# Patient Record
Sex: Female | Born: 1945
Health system: Southern US, Community
[De-identification: ages and names within clinical notes are randomized; demographics above are authoritative.]

## PROBLEM LIST (undated history)

## (undated) DIAGNOSIS — K219 Gastro-esophageal reflux disease without esophagitis: Secondary | ICD-10-CM

## (undated) DIAGNOSIS — C4490 Unspecified malignant neoplasm of skin, unspecified: Secondary | ICD-10-CM

## (undated) DIAGNOSIS — E785 Hyperlipidemia, unspecified: Secondary | ICD-10-CM

## (undated) DIAGNOSIS — M1990 Unspecified osteoarthritis, unspecified site: Secondary | ICD-10-CM

## (undated) DIAGNOSIS — T7840XA Allergy, unspecified, initial encounter: Secondary | ICD-10-CM

## (undated) DIAGNOSIS — M8580 Other specified disorders of bone density and structure, unspecified site: Secondary | ICD-10-CM

## (undated) DIAGNOSIS — F329 Major depressive disorder, single episode, unspecified: Secondary | ICD-10-CM

## (undated) DIAGNOSIS — B029 Zoster without complications: Secondary | ICD-10-CM

## (undated) DIAGNOSIS — C439 Malignant melanoma of skin, unspecified: Secondary | ICD-10-CM

## (undated) DIAGNOSIS — I2510 Atherosclerotic heart disease of native coronary artery without angina pectoris: Secondary | ICD-10-CM

## (undated) DIAGNOSIS — C801 Malignant (primary) neoplasm, unspecified: Secondary | ICD-10-CM

## (undated) DIAGNOSIS — E559 Vitamin D deficiency, unspecified: Secondary | ICD-10-CM

## (undated) DIAGNOSIS — H269 Unspecified cataract: Secondary | ICD-10-CM

## (undated) HISTORY — DX: Unspecified cataract: H26.9

## (undated) HISTORY — PX: CHOLECYSTECTOMY: SHX55

## (undated) HISTORY — DX: Unspecified malignant neoplasm of skin, unspecified: C44.90

## (undated) HISTORY — DX: Vitamin D deficiency, unspecified: E55.9

## (undated) HISTORY — PX: ROTATOR CUFF REPAIR: SHX139

## (undated) HISTORY — DX: Malignant (primary) neoplasm, unspecified: C80.1

## (undated) HISTORY — DX: Atherosclerotic heart disease of native coronary artery without angina pectoris: I25.10

## (undated) HISTORY — DX: Hyperlipidemia, unspecified: E78.5

## (undated) HISTORY — DX: Zoster without complications: B02.9

## (undated) HISTORY — DX: Allergy, unspecified, initial encounter: T78.40XA

## (undated) HISTORY — DX: Major depressive disorder, single episode, unspecified: F32.9

## (undated) HISTORY — DX: Other specified disorders of bone density and structure, unspecified site: M85.80

## (undated) HISTORY — DX: Gastro-esophageal reflux disease without esophagitis: K21.9

## (undated) HISTORY — DX: Unspecified osteoarthritis, unspecified site: M19.90

## (undated) HISTORY — DX: Malignant melanoma of skin, unspecified: C43.9

## (undated) HISTORY — PX: TONSILLECTOMY AND ADENOIDECTOMY: SUR1326

## (undated) HISTORY — PX: OTHER SURGICAL HISTORY: SHX169

---

## 1999-09-21 ENCOUNTER — Encounter: Payer: Self-pay | Admitting: Family Medicine

## 1999-09-21 ENCOUNTER — Ambulatory Visit (HOSPITAL_COMMUNITY): Admission: RE | Admit: 1999-09-21 | Discharge: 1999-09-21 | Payer: Self-pay | Admitting: Family Medicine

## 2002-02-15 ENCOUNTER — Observation Stay (HOSPITAL_COMMUNITY): Admission: EM | Admit: 2002-02-15 | Discharge: 2002-02-16 | Payer: Self-pay | Admitting: Emergency Medicine

## 2002-02-15 ENCOUNTER — Encounter: Payer: Self-pay | Admitting: Surgery

## 2010-08-03 HISTORY — PX: CORNEAL TRANSPLANT: SHX108

## 2011-05-01 DIAGNOSIS — Z947 Corneal transplant status: Secondary | ICD-10-CM | POA: Insufficient documentation

## 2011-08-04 HISTORY — PX: FOOT FRACTURE SURGERY: SHX645

## 2011-08-04 HISTORY — PX: MELANOMA EXCISION: SHX5266

## 2012-01-28 ENCOUNTER — Other Ambulatory Visit: Payer: Self-pay | Admitting: Dermatology

## 2012-02-18 ENCOUNTER — Other Ambulatory Visit: Payer: Self-pay | Admitting: Dermatology

## 2012-05-18 ENCOUNTER — Other Ambulatory Visit: Payer: Self-pay | Admitting: Dermatology

## 2012-05-20 ENCOUNTER — Ambulatory Visit: Payer: Self-pay

## 2012-05-20 ENCOUNTER — Other Ambulatory Visit: Payer: Self-pay | Admitting: Occupational Medicine

## 2012-05-20 DIAGNOSIS — M79673 Pain in unspecified foot: Secondary | ICD-10-CM

## 2012-08-18 ENCOUNTER — Other Ambulatory Visit: Payer: Self-pay | Admitting: Dermatology

## 2012-11-17 ENCOUNTER — Other Ambulatory Visit: Payer: Self-pay | Admitting: Dermatology

## 2013-03-11 ENCOUNTER — Encounter: Payer: Self-pay | Admitting: Internal Medicine

## 2013-05-18 ENCOUNTER — Other Ambulatory Visit: Payer: Self-pay | Admitting: Dermatology

## 2013-06-12 ENCOUNTER — Encounter: Payer: Self-pay | Admitting: Internal Medicine

## 2013-08-07 ENCOUNTER — Ambulatory Visit: Payer: Medicare Other | Admitting: *Deleted

## 2013-08-07 VITALS — Ht 67.0 in | Wt 156.4 lb

## 2013-08-07 DIAGNOSIS — Z1211 Encounter for screening for malignant neoplasm of colon: Secondary | ICD-10-CM

## 2013-08-07 MED ORDER — MOVIPREP 100 G PO SOLR: 1.0000 | Freq: Once | ORAL | Status: DC

## 2013-08-07 NOTE — Progress Notes (Signed)
No egg or soy allergy. No anesthesia problems.  

## 2013-08-11 ENCOUNTER — Encounter: Payer: Self-pay | Admitting: Internal Medicine

## 2013-08-18 ENCOUNTER — Encounter: Payer: Self-pay | Admitting: Internal Medicine

## 2013-08-18 ENCOUNTER — Ambulatory Visit (AMBULATORY_SURGERY_CENTER): Payer: Medicare Other | Admitting: Internal Medicine

## 2013-08-18 VITALS — BP 121/54 | HR 61 | Temp 98.1°F | Resp 18 | Ht 67.0 in | Wt 156.0 lb

## 2013-08-18 DIAGNOSIS — Z1211 Encounter for screening for malignant neoplasm of colon: Secondary | ICD-10-CM

## 2013-08-18 MED ORDER — SODIUM CHLORIDE 0.9 % IV SOLN: 500.0000 mL | INTRAVENOUS | Status: DC

## 2013-08-18 NOTE — Op Note (Signed)
[facility]  [address] [place], [id number]   COLONOSCOPY PROCEDURE REPORT  PATIENT: Sherri, Morris  MR#: [id number] BIRTHDATE: 09-Jan-1946 , 67  yrs. old GENDER: Female ENDOSCOPIST: Lafayette Dragon, MD REFERRED HM:CNOB Perini, M.D. PROCEDURE DATE:  08/18/2013 PROCEDURE:   Colonoscopy, screening First Screening Colonoscopy - Avg.  risk and is 50 yrs.  old or older - No.  Prior Negative Screening - Now for repeat screening. 10 or more years since last screening  History of Adenoma - Now for follow-up colonoscopy & has been > or = to 3 yrs.  N/A  Polyps Removed Today? No.  Recommend repeat exam, <10 yrs? No. ASA CLASS:   Class II INDICATIONS:Average risk patient for colon cancer and last colonoscopy October 2004 was normal. MEDICATIONS: MAC sedation, administered by CRNA and propofol (Diprivan) 200mg  IV  DESCRIPTION OF PROCEDURE:   After the risks benefits and alternatives of the procedure were thoroughly explained, informed consent was obtained.  A digital rectal exam revealed no abnormalities of the rectum.   The LB [id number]  endoscope was introduced through the anus and advanced to the cecum, which was identified by both the appendix and ileocecal valve. No adverse events experienced.   The quality of the prep was excellent, using MoviPrep  The instrument was then slowly withdrawn as the colon was fully examined.      COLON FINDINGS: A normal appearing cecum, ileocecal valve, and appendiceal orifice were identified.  The ascending, hepatic flexure, transverse, splenic flexure, descending, sigmoid colon and rectum appeared unremarkable.  No polyps or cancers were seen. Retroflexed views revealed no abnormalities. The time to cecum=6 minutes 55 seconds.  Withdrawal time=6 minutes 05 seconds.  The scope was withdrawn and the procedure completed. COMPLICATIONS: There were no complications.  ENDOSCOPIC IMPRESSION: Normal  colon  RECOMMENDATIONS: high fiber diet Recall colonoscopy in 10 years   eSigned:  Lafayette Dragon, MD 08/18/2013 8:31 AM   cc:

## 2013-08-18 NOTE — Progress Notes (Signed)
NO EGG OR SOY ALLERGY EWM NO PROBLEMS WITH PAST SEDATION. EWM 

## 2013-08-18 NOTE — Progress Notes (Signed)
A/ox3 pleased with MAC, report to Karol RN 

## 2013-08-18 NOTE — Patient Instructions (Addendum)
Normal Colonoscopy.  Repeat in 10 years.  YOU HAD AN ENDOSCOPIC PROCEDURE TODAY AT [facility]: Refer to the procedure report that was given to you for any specific questions about what was found during the examination.  If the procedure report does not answer your questions, please call your gastroenterologist to clarify.  If you requested that your care partner not be given the details of your procedure findings, then the procedure report has been included in a sealed envelope for you to review at your convenience later.  YOU SHOULD EXPECT: Some feelings of bloating in the abdomen. Passage of more gas than usual.  Walking can help get rid of the air that was put into your GI tract during the procedure and reduce the bloating. If you had a lower endoscopy (such as a colonoscopy or flexible sigmoidoscopy) you may notice spotting of blood in your stool or on the toilet paper. If you underwent a bowel prep for your procedure, then you may not have a normal bowel movement for a few days.  DIET: Your first meal following the procedure should be a light meal and then it is ok to progress to your normal diet.  A half-sandwich or bowl of soup is an example of a good first meal.  Heavy or fried foods are harder to digest and may make you feel nauseous or bloated.  Likewise meals heavy in dairy and vegetables can cause extra gas to form and this can also increase the bloating.  Drink plenty of fluids but you should avoid alcoholic beverages for 24 hours.  ACTIVITY: Your care partner should take you home directly after the procedure.  You should plan to take it easy, moving slowly for the rest of the day.  You can resume normal activity the day after the procedure however you should NOT DRIVE or use heavy machinery for 24 hours (because of the sedation medicines used during the test).    SYMPTOMS TO REPORT IMMEDIATELY: A gastroenterologist can be reached at any hour.  During normal business hours,  8:30 AM to 5:00 PM Monday through Friday, call [phone].  After hours and on weekends, please call the GI answering service at [phone] who will take a message and have the physician on call contact you.   Following lower endoscopy (colonoscopy or flexible sigmoidoscopy):  Excessive amounts of blood in the stool  Significant tenderness or worsening of abdominal pains  Swelling of the abdomen that is new, acute  Fever of 100F or higher  FOLLOW UP: If any biopsies were taken you will be contacted by phone or by letter within the next 1-3 weeks.  Call your gastroenterologist if you have not heard about the biopsies in 3 weeks.  Our staff will call the home number listed on your records the next business day following your procedure to check on you and address any questions or concerns that you may have at that time regarding the information given to you following your procedure. This is a courtesy call and so if there is no answer at the home number and we have not heard from you through the emergency physician on call, we will assume that you have returned to your regular daily activities without incident.  SIGNATURES/CONFIDENTIALITY: You and/or your care partner have signed paperwork which will be entered into your electronic medical record.  These signatures attest to the fact that that the information above on your After Visit Summary has been reviewed and is understood.  Full responsibility of the confidentiality of this discharge information lies with you and/or your care-partner.

## 2013-08-21 ENCOUNTER — Telehealth: Payer: Self-pay | Admitting: *Deleted

## 2013-08-21 NOTE — Telephone Encounter (Signed)
  Follow up Call-  Call back number 08/18/2013  Post procedure Call Back phone  # [phone]  Permission to leave phone message Yes     Patient questions:  Do you have a fever, pain , or abdominal swelling? no Pain Score  0 *  Have you tolerated food without any problems? yes  Have you been able to return to your normal activities? yes  Do you have any questions about your discharge instructions: Diet   no Medications  no Follow up visit  no  Do you have questions or concerns about your Care? no  Actions: * If pain score is 4 or above: No action needed, pain <4.

## 2013-08-23 ENCOUNTER — Encounter: Payer: Self-pay | Admitting: Internal Medicine

## 2013-11-16 ENCOUNTER — Other Ambulatory Visit: Payer: Self-pay | Admitting: Dermatology

## 2014-07-13 ENCOUNTER — Other Ambulatory Visit: Payer: Self-pay | Admitting: Dermatology

## 2018-11-28 ENCOUNTER — Ambulatory Visit
Admission: RE | Admit: 2018-11-28 | Discharge: 2018-11-28 | Disposition: A | Discharge status: Home / Self Care | Payer: Medicare Other | Source: Ambulatory Visit | Attending: Internal Medicine | Admitting: Internal Medicine

## 2018-11-28 ENCOUNTER — Other Ambulatory Visit: Payer: Self-pay | Admitting: Internal Medicine

## 2018-11-28 ENCOUNTER — Other Ambulatory Visit: Payer: Self-pay

## 2018-11-28 DIAGNOSIS — R0789 Other chest pain: Secondary | ICD-10-CM

## 2018-11-28 MED ORDER — IOPAMIDOL (ISOVUE-370) INJECTION 76%
75.0000 mL | Freq: Once | INTRAVENOUS | Status: AC | PRN
  Administered 2018-11-28: 12:00:00 75 mL via INTRAVENOUS

## 2018-11-29 ENCOUNTER — Other Ambulatory Visit: Payer: Self-pay | Admitting: Internal Medicine

## 2018-11-29 DIAGNOSIS — E785 Hyperlipidemia, unspecified: Secondary | ICD-10-CM

## 2018-12-16 ENCOUNTER — Ambulatory Visit: Payer: Self-pay | Admitting: Cardiology

## 2018-12-19 ENCOUNTER — Encounter: Payer: Self-pay | Admitting: Cardiology

## 2018-12-19 ENCOUNTER — Other Ambulatory Visit: Payer: Self-pay

## 2018-12-19 ENCOUNTER — Ambulatory Visit: Payer: Medicare Other | Admitting: Cardiology

## 2018-12-19 VITALS — BP 120/66 | HR 62 | Ht 67.0 in | Wt 149.4 lb

## 2018-12-19 DIAGNOSIS — R079 Chest pain, unspecified: Secondary | ICD-10-CM

## 2018-12-19 DIAGNOSIS — K219 Gastro-esophageal reflux disease without esophagitis: Secondary | ICD-10-CM

## 2018-12-19 NOTE — Progress Notes (Signed)
Primary Physician/Referring:  Crist Infante, MD  Patient ID: Sherri [place], female    DOB: 1945-12-17, 73 y.o.   MRN: [id number]  Chief Complaint  Patient presents with  . Chest Pain  . New Patient (Initial Visit)    HPI: IVALENE Morris  is a 73 y.o. female  with No significant prior cardiac vascular history, had an episode of chest pain that started on 11/27/2018 which is sudden in onset associated with difficulty in breathing, felt like she is going to pass out with the pain.  EMS was activated, however all her symptoms subsided within 15 minutes Without recurrence.  On further questioning, she states that she did have mild chest burning sensation in the night.  No other symptoms.  She did mild dyspnea with chest pain.  She did have CT angiogram of the chest the following day which was normal without PE.  She presents for cardiac consultation.  Past Medical History:  Diagnosis Date  . Allergy    enviromental  . Arthritis   . Cancer [facility])    skin     Past Surgical History:  Procedure Laterality Date  . CHOLECYSTECTOMY    . CORNEAL TRANSPLANT Bilateral 2012  . FOOT FRACTURE SURGERY Left 2013  . MELANOMA EXCISION  2013  . TONSILLECTOMY AND ADENOIDECTOMY      Social History   Socioeconomic History  . Marital status: Married    Spouse name: Not on file  . Number of children: 2  . Years of education: Not on file  . Highest education level: Not on file  Occupational History  . Not on file  Social Needs  . Financial resource strain: Not on file  . Food insecurity:    Worry: Not on file    Inability: Not on file  . Transportation needs:    Medical: Not on file    Non-medical: Not on file  Tobacco Use  . Smoking status: Current Some Day Smoker    Types: Cigarettes  . Smokeless tobacco: Never Used  . Tobacco comment: 1 CIG A DAY  Substance and Sexual Activity  . Alcohol use: Yes    Alcohol/week: 5.0 standard drinks    Types: 5 Standard drinks or equivalent per week     Comment: WINE  . Drug use: No  . Sexual activity: Not on file  Lifestyle  . Physical activity:    Days per week: Not on file    Minutes per session: Not on file  . Stress: Not on file  Relationships  . Social connections:    Talks on phone: Not on file    Gets together: Not on file    Attends religious service: Not on file    Active member of club or organization: Not on file    Attends meetings of clubs or organizations: Not on file    Relationship status: Not on file  . Intimate partner violence:    Fear of current or ex partner: Not on file    Emotionally abused: Not on file    Physically abused: Not on file    Forced sexual activity: Not on file  Other Topics Concern  . Not on file  Social History Narrative  . Not on file    Review of Systems  Constitution: Negative for chills, decreased appetite, malaise/fatigue and weight gain.  Cardiovascular: Negative for dyspnea on exertion, leg swelling and syncope.  Endocrine: Negative for cold intolerance.  Hematologic/Lymphatic: Does not bruise/bleed easily.  Musculoskeletal: Negative  for joint swelling.  Gastrointestinal: Negative for abdominal pain, anorexia, change in bowel habit, hematochezia and melena.  Neurological: Negative for headaches and light-headedness.  Psychiatric/Behavioral: Negative for depression and substance abuse.  All other systems reviewed and are negative.     Objective  Blood pressure 120/66, pulse 62, height 5\' 7"  (1.702 m), weight 149 lb 6.4 oz (67.8 kg), SpO2 98 %. Body mass index is 23.4 kg/m.    Physical Exam  Constitutional: She appears well-developed and well-nourished. No distress.  HENT:  Head: Atraumatic.  Eyes: Conjunctivae are normal.  Neck: Neck supple. No JVD present. No thyromegaly present.  Cardiovascular: Normal rate, regular rhythm, normal heart sounds and intact distal pulses. Exam reveals no gallop.  No murmur heard. Pulmonary/Chest: Effort normal and breath sounds normal.   Abdominal: Soft. Bowel sounds are normal.  Musculoskeletal: Normal range of motion.  Neurological: She is alert.  Skin: Skin is warm and dry.  Psychiatric: She has a normal mood and affect.   Radiology: No results found.  Laboratory examination:    No flowsheet data found. No flowsheet data found. Lipid Panel  No results found for: CHOL, TRIG, HDL, CHOLHDL, VLDL, LDLCALC, LDLDIRECT HEMOGLOBIN A1C No results found for: HGBA1C, MPG TSH No results for input(s): TSH in the last 8760 hours.  PRN Meds:. Medications Discontinued During This Encounter  Medication Reason  . aspirin 81 MG tablet Error  . cetirizine (ZYRTEC) 10 MG tablet Error  . diphenhydrAMINE (BENADRYL) 25 mg capsule Error   Current Meds  Medication Sig  . Calcium Carb-Cholecalciferol (CALCIUM 600/VITAMIN D3) 600-800 MG-UNIT TABS Take 1 tablet by mouth.  . estrogen, conjugated,-medroxyprogesterone (PREMPRO) 0.3-1.5 MG per tablet Take 1 tablet by mouth daily.  . fexofenadine (ALLEGRA) 60 MG tablet Take 60 mg by mouth daily.  . fluticasone (FLONASE) 50 MCG/ACT nasal spray Place into both nostrils daily.  . Multiple Vitamin (MULTI VITAMIN DAILY PO) Take 1 tablet by mouth.  . Turmeric 500 MG CAPS Take 1,000 mg by mouth daily.  . vitamin C (ASCORBIC ACID) 500 MG tablet Take 500 mg by mouth daily.    Cardiac Studies:   CT angiogram chest 11/28/2018: Normal.  No evidence of PE, cardiac structures normal.  No abnormality is detected.  Assessment   Chest pain due to GERD - Plan: EKG 12-Lead  EKG 12/19/2018: Sinus bradycardia at rate of 54 bpm, normal axis.  Incomplete right bundle branch block.  Otherwise normal EKG. No change from EKG EKG 11/27/18.  Recommendations:   Patient's chest pain symptoms are probably related to GERD.  Patient had heartburning sensation the night before the event happened in the morning, the whole episode lasted for about 10-15 minutes with no recurrence and she'll return to full activity  without limitation.  No coronary calcification noted on the CT scan as well.  Although she has family history of coronary artery disease in her father and also her brother, they are high cholesterol, female.  Her mother or her siblings have not had any premature coronary artery disease in females.  Would not recommend any further evaluation, she is fairly active and physical exam and EKG are unremarkable.  Adrian Prows, MD, [facility] 12/19/2018, 12:52 PM [facility] [place] Pager: [phone] Office: [phone] If no answer Cell [phone]

## 2019-06-28 ENCOUNTER — Other Ambulatory Visit: Payer: Self-pay

## 2019-06-28 DIAGNOSIS — Z20822 Contact with and (suspected) exposure to covid-19: Secondary | ICD-10-CM

## 2019-07-01 ENCOUNTER — Telehealth: Payer: Self-pay

## 2019-07-01 NOTE — Telephone Encounter (Signed)
Pt called for results advised that results are not back.

## 2019-07-04 LAB — NOVEL CORONAVIRUS, NAA: SARS-CoV-2, NAA: DETECTED — AB

## 2019-09-12 ENCOUNTER — Ambulatory Visit: Payer: Medicare PPO | Attending: Internal Medicine

## 2019-09-12 DIAGNOSIS — Z23 Encounter for immunization: Secondary | ICD-10-CM

## 2019-09-12 NOTE — Progress Notes (Signed)
   Covid-19 Vaccination Clinic  Name:  Sherri Morris    MRN: [id number] DOB: 1946-03-31  09/12/2019  Ms. Sherri Morris was observed post Covid-19 immunization for 15 minutes without incidence. She was provided with Vaccine Information Sheet and instruction to access the V-Safe system.   Ms. Sherri Morris was instructed to call 911 with any severe reactions post vaccine: Marland Kitchen Difficulty breathing  . Swelling of your face and throat  . A fast heartbeat  . A bad rash all over your body  . Dizziness and weakness    Immunizations Administered    Name Date Dose VIS Date Route   Pfizer COVID-19 Vaccine 09/12/2019 11:23 AM 0.3 mL 07/14/2019 Intramuscular   Manufacturer: [place]   Lot: [id number]   [place]: [id number]

## 2019-09-14 ENCOUNTER — Ambulatory Visit: Payer: Self-pay

## 2019-10-07 ENCOUNTER — Ambulatory Visit: Payer: Medicare PPO | Attending: Internal Medicine

## 2019-10-07 ENCOUNTER — Ambulatory Visit: Payer: Self-pay

## 2019-10-07 DIAGNOSIS — Z23 Encounter for immunization: Secondary | ICD-10-CM | POA: Insufficient documentation

## 2019-10-07 NOTE — Progress Notes (Signed)
   Covid-19 Vaccination Clinic  Name:  Sherri Morris    MRN: [id number] DOB: Dec 14, 1945  10/07/2019  [name] was observed post Covid-19 immunization for 15 minutes without incident. She was provided with Vaccine Information Sheet and instruction to access the V-Safe system.   [name] was instructed to call 911 with any severe reactions post vaccine: Marland Kitchen Difficulty breathing  . Swelling of face and throat  . A fast heartbeat  . A bad rash all over body  . Dizziness and weakness   Immunizations Administered    Name Date Dose VIS Date Route   Pfizer COVID-19 Vaccine 10/07/2019 11:19 AM 0.3 mL 07/14/2019 Intramuscular   Manufacturer: [place]   Lot: [id number]   [place]: [id number]

## 2019-10-09 ENCOUNTER — Ambulatory Visit: Payer: Medicare PPO | Admitting: Podiatry

## 2019-10-09 ENCOUNTER — Other Ambulatory Visit: Payer: Self-pay

## 2019-10-09 DIAGNOSIS — L600 Ingrowing nail: Secondary | ICD-10-CM | POA: Diagnosis not present

## 2019-10-09 MED ORDER — GENTAMICIN SULFATE 0.1 % EX CREA: 1.0000 "application " | TOPICAL_CREAM | Freq: Two times a day (BID) | CUTANEOUS | 1 refills | Status: AC

## 2019-10-09 NOTE — Patient Instructions (Signed)

## 2019-10-12 NOTE — Progress Notes (Signed)
   Subjective: Patient presents today for evaluation of sharp pain to the medial border of the right great toe that has been gradually worsening over the past year. Patient is concerned for possible ingrown nail. Walking and wearing certain shoes increases the pain. She has been gettting pedicures and soaking the toe in Epsom salt for treatment with no significant relief. Patient presents today for further treatment and evaluation.  Past Medical History:  Diagnosis Date  . Allergy    enviromental  . Arthritis   . Cancer ([place])    skin     Objective:  General: Well developed, nourished, in no acute distress, alert and oriented x3   Dermatology: Skin is warm, dry and supple bilateral. Medial border of the right great toe appears to be erythematous with evidence of an ingrowing nail. Pain on palpation noted to the border of the nail fold. The remaining nails appear unremarkable at this time. There are no open sores, lesions.  Vascular: Dorsalis Pedis artery and Posterior Tibial artery pedal pulses palpable. No lower extremity edema noted.   Neruologic: Grossly intact via light touch bilateral.  Musculoskeletal: Muscular strength within normal limits in all groups bilateral. Normal range of motion noted to all pedal and ankle joints.   Assesement: #1 Paronychia with ingrowing nail medial border right great toe #2 Pain in toe #3 Incurvated nail  Plan of Care:  1. Patient evaluated.  2. Discussed treatment alternatives and plan of care. Explained nail avulsion procedure and post procedure course to patient. 3. Patient opted for permanent partial nail avulsion of the medial border right great toe.  4. Prior to procedure, local anesthesia infiltration utilized using 3 ml of a 50:50 mixture of 2% plain lidocaine and 0.5% plain marcaine in a normal hallux block fashion and a betadine prep performed.  5. Partial permanent nail avulsion with chemical matrixectomy performed using [id number]  applications of phenol followed by alcohol flush.  6. Light dressing applied. 7. Prescription for Gentamicin cream provided to patient to use daily with a bandage.  8. Return to clinic in 2 weeks.  Edrick Kins, DPM [facility]  Dr. Edrick Kins, [address]                                        [place], [place] [address]                Office [phone]  Fax [phone]

## 2019-10-23 ENCOUNTER — Ambulatory Visit (INDEPENDENT_AMBULATORY_CARE_PROVIDER_SITE_OTHER): Payer: Medicare PPO | Admitting: Podiatry

## 2019-10-23 ENCOUNTER — Other Ambulatory Visit: Payer: Self-pay

## 2019-10-23 DIAGNOSIS — L600 Ingrowing nail: Secondary | ICD-10-CM

## 2019-10-25 NOTE — Progress Notes (Signed)
   Subjective: 74 y.o. female presents today status post permanent nail avulsion procedure of the medial border of the right great toe that was performed on 10/09/2019. She states she is doing well. She reports some intermittent tenderness of the toe that is caused by wearing tennis shoes. She has been applying Gentamicin cream as directed. Patient is here for further evaluation and treatment.    Past Medical History:  Diagnosis Date  . Allergy    enviromental  . Arthritis   . Cancer ([place])    skin     Objective: Skin is warm, dry and supple. Nail and respective nail fold appears to be healing appropriately. Open wound to the associated nail fold with a granular wound base and moderate amount of fibrotic tissue. Minimal drainage noted. Mild erythema around the periungual region likely due to phenol chemical matricectomy.  Assessment: #1 postop permanent partial nail avulsion medial border right great toe  #2 open wound periungual nail fold of respective digit.   Plan of care: #1 patient was evaluated  #2 debridement of open wound was performed to the periungual border of the respective toe using a currette. Antibiotic ointment and Band-Aid was applied. #3 patient is to return to clinic on a PRN basis.   Edrick Kins, DPM [facility]  Dr. Edrick Kins, [address]                                        [place], [place] [address]                Office [phone]  Fax [phone]

## 2019-11-07 DIAGNOSIS — M7542 Impingement syndrome of left shoulder: Secondary | ICD-10-CM | POA: Insufficient documentation

## 2019-12-02 HISTORY — PX: ROTATOR CUFF REPAIR: SHX139

## 2020-03-04 DIAGNOSIS — M25512 Pain in left shoulder: Secondary | ICD-10-CM | POA: Diagnosis not present

## 2020-03-05 DIAGNOSIS — Z85820 Personal history of malignant melanoma of skin: Secondary | ICD-10-CM | POA: Diagnosis not present

## 2020-03-05 DIAGNOSIS — Z85828 Personal history of other malignant neoplasm of skin: Secondary | ICD-10-CM | POA: Diagnosis not present

## 2020-03-05 DIAGNOSIS — D044 Carcinoma in situ of skin of scalp and neck: Secondary | ICD-10-CM | POA: Diagnosis not present

## 2020-03-08 DIAGNOSIS — M25512 Pain in left shoulder: Secondary | ICD-10-CM | POA: Diagnosis not present

## 2020-03-11 DIAGNOSIS — M25512 Pain in left shoulder: Secondary | ICD-10-CM | POA: Diagnosis not present

## 2020-03-13 DIAGNOSIS — M25512 Pain in left shoulder: Secondary | ICD-10-CM | POA: Diagnosis not present

## 2020-03-20 DIAGNOSIS — Z947 Corneal transplant status: Secondary | ICD-10-CM | POA: Diagnosis not present

## 2020-03-20 DIAGNOSIS — Z961 Presence of intraocular lens: Secondary | ICD-10-CM | POA: Diagnosis not present

## 2020-03-20 DIAGNOSIS — H18519 Endothelial corneal dystrophy, unspecified eye: Secondary | ICD-10-CM | POA: Diagnosis not present

## 2020-03-27 DIAGNOSIS — M25512 Pain in left shoulder: Secondary | ICD-10-CM | POA: Diagnosis not present

## 2020-03-29 DIAGNOSIS — M25512 Pain in left shoulder: Secondary | ICD-10-CM | POA: Diagnosis not present

## 2020-04-01 DIAGNOSIS — M25512 Pain in left shoulder: Secondary | ICD-10-CM | POA: Diagnosis not present

## 2020-04-03 DIAGNOSIS — M25512 Pain in left shoulder: Secondary | ICD-10-CM | POA: Diagnosis not present

## 2020-04-05 DIAGNOSIS — M25512 Pain in left shoulder: Secondary | ICD-10-CM | POA: Diagnosis not present

## 2020-04-09 DIAGNOSIS — M25512 Pain in left shoulder: Secondary | ICD-10-CM | POA: Diagnosis not present

## 2020-04-12 DIAGNOSIS — M25512 Pain in left shoulder: Secondary | ICD-10-CM | POA: Diagnosis not present

## 2020-04-15 DIAGNOSIS — Z4789 Encounter for other orthopedic aftercare: Secondary | ICD-10-CM | POA: Diagnosis not present

## 2020-04-15 DIAGNOSIS — M7502 Adhesive capsulitis of left shoulder: Secondary | ICD-10-CM | POA: Diagnosis not present

## 2020-04-15 DIAGNOSIS — M7542 Impingement syndrome of left shoulder: Secondary | ICD-10-CM | POA: Diagnosis not present

## 2020-04-23 DIAGNOSIS — M25512 Pain in left shoulder: Secondary | ICD-10-CM | POA: Diagnosis not present

## 2020-04-29 ENCOUNTER — Ambulatory Visit: Payer: Medicare PPO | Admitting: Podiatry

## 2020-04-29 ENCOUNTER — Other Ambulatory Visit: Payer: Self-pay

## 2020-04-29 DIAGNOSIS — L600 Ingrowing nail: Secondary | ICD-10-CM | POA: Diagnosis not present

## 2020-04-29 MED ORDER — GENTAMICIN SULFATE 0.1 % EX CREA: 1.0000 "application " | TOPICAL_CREAM | Freq: Two times a day (BID) | CUTANEOUS | 1 refills | Status: DC

## 2020-04-29 NOTE — Progress Notes (Addendum)
   Subjective: Patient presents today for evaluation of pain to the medial border of the bilateral great toes. Patient is concerned for possible ingrown nail.  Patient has history of nail matricectomy to the medial border of the right great toe in March 2021.  Patient states she was doing great until recently when she began to experience pain again to the medial border of the right great toe.  She also has chronic medial toe pain to the left great toe as well concerning for ingrown toenail.  Patient presents today for further treatment and evaluation.  Past Medical History:  Diagnosis Date  . Allergy    enviromental  . Arthritis   . Cancer ([place])    skin     Objective:  General: Well developed, nourished, in no acute distress, alert and oriented x3   Dermatology: Skin is warm, dry and supple bilateral.  Medial border bilateral great toes appears to be erythematous with evidence of an ingrowing nail. Pain on palpation noted to the border of the nail fold. The remaining nails appear unremarkable at this time. There are no open sores, lesions.  Vascular: Dorsalis Pedis artery and Posterior Tibial artery pedal pulses palpable. No lower extremity edema noted.   Neruologic: Grossly intact via light touch bilateral.  Musculoskeletal: Muscular strength within normal limits in all groups bilateral. Normal range of motion noted to all pedal and ankle joints.   Assesement: #1 Paronychia with ingrowing nail medial border bilateral great toes #2 Pain in toe #3 Incurvated nail  Plan of Care:  1. Patient evaluated.  2. Discussed treatment alternatives and plan of care. Explained nail avulsion procedure and post procedure course to patient. 3. Patient opted for permanent partial nail avulsion of the medial border bilateral great toes.  4. Prior to procedure, local anesthesia infiltration utilized using 3 ml of a 50:50 mixture of 2% plain lidocaine and 0.5% plain marcaine in a normal hallux block  fashion and a betadine prep performed.  5. Partial permanent nail avulsion with chemical matrixectomy performed using [id number] applications of phenol followed by alcohol flush.  6. Light dressing applied. 7.  Prescription for gentamicin cream.  Apply daily.   8.  Return to clinic 2 weeks.   Edrick Kins, DPM [facility]  Dr. Edrick Kins, [place]                                        [place], [place] [address]                Office [phone]  Fax [phone]

## 2020-04-29 NOTE — Patient Instructions (Signed)

## 2020-04-30 DIAGNOSIS — M25512 Pain in left shoulder: Secondary | ICD-10-CM | POA: Diagnosis not present

## 2020-05-02 DIAGNOSIS — M25512 Pain in left shoulder: Secondary | ICD-10-CM | POA: Diagnosis not present

## 2020-05-04 DIAGNOSIS — Z23 Encounter for immunization: Secondary | ICD-10-CM | POA: Diagnosis not present

## 2020-05-07 DIAGNOSIS — M25512 Pain in left shoulder: Secondary | ICD-10-CM | POA: Diagnosis not present

## 2020-05-10 DIAGNOSIS — M25512 Pain in left shoulder: Secondary | ICD-10-CM | POA: Diagnosis not present

## 2020-05-13 DIAGNOSIS — M25512 Pain in left shoulder: Secondary | ICD-10-CM | POA: Diagnosis not present

## 2020-05-13 DIAGNOSIS — M7542 Impingement syndrome of left shoulder: Secondary | ICD-10-CM | POA: Diagnosis not present

## 2020-05-15 ENCOUNTER — Other Ambulatory Visit: Payer: Self-pay

## 2020-05-15 ENCOUNTER — Ambulatory Visit (INDEPENDENT_AMBULATORY_CARE_PROVIDER_SITE_OTHER): Payer: Medicare PPO | Admitting: Podiatry

## 2020-05-15 VITALS — Temp 98.0°F

## 2020-05-15 DIAGNOSIS — L600 Ingrowing nail: Secondary | ICD-10-CM

## 2020-05-15 MED ORDER — DOXYCYCLINE HYCLATE 100 MG PO TABS: 100.0000 mg | ORAL_TABLET | Freq: Two times a day (BID) | ORAL | 0 refills | Status: DC

## 2020-05-15 NOTE — Progress Notes (Signed)
   Subjective: 74 y.o. female presents today status post permanent nail avulsion procedure of the medial border of the bilateral great toes that was performed on 04/29/2020.  Patient states that she has had some pain and sensitivity ever since the procedure.  She has been walking and ambulating with normal activity however she has pain throughout the day.  Most recently this morning she states that she did not have any pain when she was getting ready for the day.  She has been soaking her feet and applying the antibiotic cream as directed.  Past Medical History:  Diagnosis Date  . Allergy    enviromental  . Arthritis   . Cancer ([place])    skin     Objective: Skin is warm, dry and supple. Nail and respective nail fold appears to be healing appropriately. Open wound to the associated nail fold with a granular wound base and moderate amount of fibrotic tissue. Minimal drainage noted. Mild erythema around the periungual region likely due to phenol chemical matricectomy.  Assessment: #1 postop permanent partial nail avulsion medial border bilateral great toes #2 open wound periungual nail fold of respective digit.   Plan of care: #1 patient was evaluated  #2 debridement of open wound was performed to the periungual border of the respective toe using a currette. Antibiotic ointment and Band-Aid was applied. #3  Continue Epsom salts soaks daily and antibiotic gentamicin cream  #4 prescription for doxycycline 100 mg #14 2 times daily  #5 patient is to return to clinic 2 weeks   Edrick Kins, DPM [facility]  Dr. Edrick Kins, [address]                                        [place], [place] [address]                Office [phone]  Fax [phone]

## 2020-05-24 DIAGNOSIS — M25512 Pain in left shoulder: Secondary | ICD-10-CM | POA: Diagnosis not present

## 2020-05-29 ENCOUNTER — Other Ambulatory Visit: Payer: Self-pay

## 2020-05-29 ENCOUNTER — Ambulatory Visit (INDEPENDENT_AMBULATORY_CARE_PROVIDER_SITE_OTHER): Payer: Medicare PPO | Admitting: Podiatry

## 2020-05-29 DIAGNOSIS — L600 Ingrowing nail: Secondary | ICD-10-CM | POA: Diagnosis not present

## 2020-05-29 DIAGNOSIS — Z01818 Encounter for other preprocedural examination: Secondary | ICD-10-CM | POA: Diagnosis not present

## 2020-05-29 DIAGNOSIS — H18511 Endothelial corneal dystrophy, right eye: Secondary | ICD-10-CM | POA: Diagnosis not present

## 2020-05-29 DIAGNOSIS — H2511 Age-related nuclear cataract, right eye: Secondary | ICD-10-CM | POA: Diagnosis not present

## 2020-05-29 NOTE — Progress Notes (Signed)
   Subjective: 74 y.o. female presents today status post permanent nail avulsion procedure of the medial border of the bilateral great toes that was performed on 05/15/2020.  Patient states that she feels much better.  She has been soaking her foot and applying the antibiotic cream as directed.  No new complaints at this time  Past Medical History:  Diagnosis Date  . Allergy    enviromental  . Arthritis   . Cancer ([place])    skin     Objective: Skin is warm, dry and supple. Nail and respective nail fold appears to be healing appropriately. Open wound to the associated nail fold with a granular wound base and moderate amount of fibrotic tissue. Minimal drainage noted. Mild erythema around the periungual region likely due to phenol chemical matricectomy.  Assessment: #1 postop permanent partial nail avulsion medial border bilateral great toes #2 open wound periungual nail fold of respective digit.   Plan of care: #1 patient was evaluated  #2 debridement of open wound was performed to the periungual border of the respective toe using a currette. Antibiotic ointment and Band-Aid was applied. #3 patient is to return to clinic on a PRN basis.   Edrick Kins, DPM [facility]  Dr. Edrick Kins, [address]                                        [place], [place] [address]                Office [phone]  Fax [phone]

## 2020-06-03 HISTORY — PX: CORNEAL TRANSPLANT: SHX108

## 2020-06-06 DIAGNOSIS — H18511 Endothelial corneal dystrophy, right eye: Secondary | ICD-10-CM | POA: Diagnosis not present

## 2020-06-06 DIAGNOSIS — H18519 Endothelial corneal dystrophy, unspecified eye: Secondary | ICD-10-CM | POA: Diagnosis not present

## 2020-06-06 DIAGNOSIS — H2511 Age-related nuclear cataract, right eye: Secondary | ICD-10-CM | POA: Diagnosis not present

## 2020-06-07 DIAGNOSIS — H2511 Age-related nuclear cataract, right eye: Secondary | ICD-10-CM | POA: Diagnosis not present

## 2020-06-07 DIAGNOSIS — Z48810 Encounter for surgical aftercare following surgery on the sense organs: Secondary | ICD-10-CM | POA: Diagnosis not present

## 2020-06-07 DIAGNOSIS — Z947 Corneal transplant status: Secondary | ICD-10-CM | POA: Diagnosis not present

## 2020-06-07 DIAGNOSIS — H1013 Acute atopic conjunctivitis, bilateral: Secondary | ICD-10-CM | POA: Diagnosis not present

## 2020-06-07 DIAGNOSIS — Z961 Presence of intraocular lens: Secondary | ICD-10-CM | POA: Diagnosis not present

## 2020-06-07 DIAGNOSIS — H18513 Endothelial corneal dystrophy, bilateral: Secondary | ICD-10-CM | POA: Diagnosis not present

## 2020-06-10 DIAGNOSIS — H1013 Acute atopic conjunctivitis, bilateral: Secondary | ICD-10-CM | POA: Diagnosis not present

## 2020-06-10 DIAGNOSIS — H18513 Endothelial corneal dystrophy, bilateral: Secondary | ICD-10-CM | POA: Diagnosis not present

## 2020-06-10 DIAGNOSIS — Z48810 Encounter for surgical aftercare following surgery on the sense organs: Secondary | ICD-10-CM | POA: Diagnosis not present

## 2020-06-10 DIAGNOSIS — H2511 Age-related nuclear cataract, right eye: Secondary | ICD-10-CM | POA: Diagnosis not present

## 2020-06-10 DIAGNOSIS — Z961 Presence of intraocular lens: Secondary | ICD-10-CM | POA: Diagnosis not present

## 2020-06-10 DIAGNOSIS — Z947 Corneal transplant status: Secondary | ICD-10-CM | POA: Diagnosis not present

## 2020-07-12 DIAGNOSIS — J301 Allergic rhinitis due to pollen: Secondary | ICD-10-CM | POA: Diagnosis not present

## 2020-07-12 DIAGNOSIS — J3081 Allergic rhinitis due to animal (cat) (dog) hair and dander: Secondary | ICD-10-CM | POA: Diagnosis not present

## 2020-07-12 DIAGNOSIS — H1045 Other chronic allergic conjunctivitis: Secondary | ICD-10-CM | POA: Diagnosis not present

## 2020-07-12 DIAGNOSIS — R059 Cough, unspecified: Secondary | ICD-10-CM | POA: Diagnosis not present

## 2020-07-15 DIAGNOSIS — M25512 Pain in left shoulder: Secondary | ICD-10-CM | POA: Diagnosis not present

## 2020-07-18 DIAGNOSIS — M25512 Pain in left shoulder: Secondary | ICD-10-CM | POA: Diagnosis not present

## 2020-08-01 DIAGNOSIS — M25512 Pain in left shoulder: Secondary | ICD-10-CM | POA: Diagnosis not present

## 2020-08-05 DIAGNOSIS — M25512 Pain in left shoulder: Secondary | ICD-10-CM | POA: Diagnosis not present

## 2020-08-08 DIAGNOSIS — M25512 Pain in left shoulder: Secondary | ICD-10-CM | POA: Diagnosis not present

## 2020-08-13 DIAGNOSIS — M25512 Pain in left shoulder: Secondary | ICD-10-CM | POA: Diagnosis not present

## 2020-08-16 DIAGNOSIS — Z1231 Encounter for screening mammogram for malignant neoplasm of breast: Secondary | ICD-10-CM | POA: Diagnosis not present

## 2020-08-22 DIAGNOSIS — Z85820 Personal history of malignant melanoma of skin: Secondary | ICD-10-CM | POA: Diagnosis not present

## 2020-08-22 DIAGNOSIS — L821 Other seborrheic keratosis: Secondary | ICD-10-CM | POA: Diagnosis not present

## 2020-08-22 DIAGNOSIS — D0472 Carcinoma in situ of skin of left lower limb, including hip: Secondary | ICD-10-CM | POA: Diagnosis not present

## 2020-08-22 DIAGNOSIS — D225 Melanocytic nevi of trunk: Secondary | ICD-10-CM | POA: Diagnosis not present

## 2020-08-22 DIAGNOSIS — D485 Neoplasm of uncertain behavior of skin: Secondary | ICD-10-CM | POA: Diagnosis not present

## 2020-08-22 DIAGNOSIS — L814 Other melanin hyperpigmentation: Secondary | ICD-10-CM | POA: Diagnosis not present

## 2020-08-22 DIAGNOSIS — L304 Erythema intertrigo: Secondary | ICD-10-CM | POA: Diagnosis not present

## 2020-08-22 DIAGNOSIS — C44722 Squamous cell carcinoma of skin of right lower limb, including hip: Secondary | ICD-10-CM | POA: Diagnosis not present

## 2020-08-22 DIAGNOSIS — Z85828 Personal history of other malignant neoplasm of skin: Secondary | ICD-10-CM | POA: Diagnosis not present

## 2020-08-22 DIAGNOSIS — L438 Other lichen planus: Secondary | ICD-10-CM | POA: Diagnosis not present

## 2020-08-22 DIAGNOSIS — L570 Actinic keratosis: Secondary | ICD-10-CM | POA: Diagnosis not present

## 2020-08-26 DIAGNOSIS — M25512 Pain in left shoulder: Secondary | ICD-10-CM | POA: Diagnosis not present

## 2020-09-03 DIAGNOSIS — M25512 Pain in left shoulder: Secondary | ICD-10-CM | POA: Diagnosis not present

## 2020-09-09 DIAGNOSIS — M25512 Pain in left shoulder: Secondary | ICD-10-CM | POA: Diagnosis not present

## 2020-09-18 DIAGNOSIS — H18513 Endothelial corneal dystrophy, bilateral: Secondary | ICD-10-CM | POA: Diagnosis not present

## 2020-09-18 DIAGNOSIS — Z9841 Cataract extraction status, right eye: Secondary | ICD-10-CM | POA: Diagnosis not present

## 2020-09-18 DIAGNOSIS — Z961 Presence of intraocular lens: Secondary | ICD-10-CM | POA: Diagnosis not present

## 2020-09-18 DIAGNOSIS — Z947 Corneal transplant status: Secondary | ICD-10-CM | POA: Diagnosis not present

## 2020-09-27 DIAGNOSIS — M25512 Pain in left shoulder: Secondary | ICD-10-CM | POA: Diagnosis not present

## 2020-10-29 DIAGNOSIS — M25512 Pain in left shoulder: Secondary | ICD-10-CM | POA: Diagnosis not present

## 2020-11-06 DIAGNOSIS — M25512 Pain in left shoulder: Secondary | ICD-10-CM | POA: Diagnosis not present

## 2020-11-13 DIAGNOSIS — M25512 Pain in left shoulder: Secondary | ICD-10-CM | POA: Diagnosis not present

## 2020-11-22 DIAGNOSIS — Z947 Corneal transplant status: Secondary | ICD-10-CM | POA: Diagnosis not present

## 2020-11-22 DIAGNOSIS — Z9841 Cataract extraction status, right eye: Secondary | ICD-10-CM | POA: Diagnosis not present

## 2020-11-22 DIAGNOSIS — Z961 Presence of intraocular lens: Secondary | ICD-10-CM | POA: Diagnosis not present

## 2020-11-22 DIAGNOSIS — Z8669 Personal history of other diseases of the nervous system and sense organs: Secondary | ICD-10-CM | POA: Diagnosis not present

## 2020-11-27 DIAGNOSIS — M170 Bilateral primary osteoarthritis of knee: Secondary | ICD-10-CM | POA: Diagnosis not present

## 2020-12-11 ENCOUNTER — Other Ambulatory Visit: Payer: Self-pay

## 2020-12-11 ENCOUNTER — Ambulatory Visit (INDEPENDENT_AMBULATORY_CARE_PROVIDER_SITE_OTHER): Payer: Medicare PPO | Admitting: Podiatry

## 2020-12-11 DIAGNOSIS — J3081 Allergic rhinitis due to animal (cat) (dog) hair and dander: Secondary | ICD-10-CM | POA: Insufficient documentation

## 2020-12-11 DIAGNOSIS — K219 Gastro-esophageal reflux disease without esophagitis: Secondary | ICD-10-CM | POA: Insufficient documentation

## 2020-12-11 DIAGNOSIS — J301 Allergic rhinitis due to pollen: Secondary | ICD-10-CM | POA: Insufficient documentation

## 2020-12-11 DIAGNOSIS — L600 Ingrowing nail: Secondary | ICD-10-CM

## 2020-12-11 DIAGNOSIS — H1045 Other chronic allergic conjunctivitis: Secondary | ICD-10-CM | POA: Insufficient documentation

## 2020-12-11 DIAGNOSIS — R059 Cough, unspecified: Secondary | ICD-10-CM | POA: Insufficient documentation

## 2020-12-11 NOTE — Progress Notes (Signed)
   HPI: 75 y.o. female presenting today for evaluation of right toe pain.  Patient has a history of partial nail matricectomy's to the medial border of the right great toe 3 times in the past now.  She continues to have pain and tenderness to the right hallux nail plate.  She states is very painful with sheets touching the nail.  She presents for further treatment and evaluation  Past Medical History:  Diagnosis Date  . Allergy    enviromental  . Arthritis   . Cancer [facility])    skin      Physical Exam: General: The patient is alert and oriented x3 in no acute distress.  Dermatology: Skin is warm, dry and supple bilateral lower extremities. Negative for open lesions or macerations.  Right hallux nail plate does appear to be a dystrophic and thickened with an incurvated pincer nail deformity.  Vascular: Palpable pedal pulses bilaterally. No edema or erythema noted. Capillary refill within normal limits.  Neurological: Epicritic and protective threshold grossly intact bilaterally.   Musculoskeletal Exam: Range of motion within normal limits to all pedal and ankle joints bilateral. Muscle strength 5/5 in all groups bilateral.    Assessment: 1.  Pincer nail deformity/ingrown right   Plan of Care:  1. Patient evaluated.  2.  Today we discussed different treatment options.  I do feel it is in the patient's best interest to perform total temporary nail avulsion of the right hallux nail plate and allow a new nail to grow in.  Patient agrees.  The toe was prepped in aseptic manner and digital block performed using 3 mL of lidocaine plain. 3.  Nail was avulsed in its entirety and dry sterile dressings were applied. 4.  Post care instructions provided 5.  Return to clinic in 2 weeks      Edrick Kins, DPM [facility]  Dr. Edrick Kins, DPM    [address], [place] [address]                Office [phone]  Fax [phone]

## 2020-12-16 ENCOUNTER — Ambulatory Visit: Payer: Medicare PPO | Admitting: Podiatry

## 2020-12-25 ENCOUNTER — Other Ambulatory Visit: Payer: Self-pay

## 2020-12-25 ENCOUNTER — Ambulatory Visit: Payer: Medicare PPO | Admitting: Podiatry

## 2020-12-25 DIAGNOSIS — L600 Ingrowing nail: Secondary | ICD-10-CM | POA: Diagnosis not present

## 2020-12-25 NOTE — Progress Notes (Signed)
   HPI: 75 y.o. female presenting today status post total temporary nail avulsion performed last visit on 12/11/2020.  Patient states that she feels much better.  She is not having any of the pain that she has had with the toenail.  She has been soaking her foot and applying antibiotic cream as instructed.  Past Medical History:  Diagnosis Date  . Allergy    enviromental  . Arthritis   . Cancer [facility])    skin      Physical Exam: General: The patient is alert and oriented x3 in no acute distress.  Dermatology: Absence of the right hallux nail plate noted with a dry stable nailbed with routine healing.  No drainage.  No erythema noted.  Vascular: Palpable pedal pulses bilaterally. No edema or erythema noted. Capillary refill within normal limits.  Neurological: Epicritic and protective threshold grossly intact bilaterally.   Musculoskeletal Exam: No pedal deformities noted   Assessment: 1.  Status post total temporary nail avulsion right hallux nail plate   Plan of Care:  1. Patient evaluated 2.  Patient may resume full activity no restrictions. 3.  Recommend daily hydration and moisturizer to the area to keep it moist while the nail regrows 4.  Return to clinic as needed      Edrick Kins, DPM [facility]  Dr. Edrick Kins, DPM    [address], [place] [address]                Office [phone]  Fax [phone]

## 2020-12-27 DIAGNOSIS — E785 Hyperlipidemia, unspecified: Secondary | ICD-10-CM | POA: Diagnosis not present

## 2020-12-31 DIAGNOSIS — M25512 Pain in left shoulder: Secondary | ICD-10-CM | POA: Diagnosis not present

## 2021-01-03 DIAGNOSIS — R8290 Unspecified abnormal findings in urine: Secondary | ICD-10-CM | POA: Diagnosis not present

## 2021-01-03 DIAGNOSIS — J309 Allergic rhinitis, unspecified: Secondary | ICD-10-CM | POA: Diagnosis not present

## 2021-01-03 DIAGNOSIS — R82998 Other abnormal findings in urine: Secondary | ICD-10-CM | POA: Diagnosis not present

## 2021-01-03 DIAGNOSIS — C439 Malignant melanoma of skin, unspecified: Secondary | ICD-10-CM | POA: Diagnosis not present

## 2021-01-03 DIAGNOSIS — M8580 Other specified disorders of bone density and structure, unspecified site: Secondary | ICD-10-CM | POA: Diagnosis not present

## 2021-01-03 DIAGNOSIS — M8589 Other specified disorders of bone density and structure, multiple sites: Secondary | ICD-10-CM | POA: Diagnosis not present

## 2021-01-03 DIAGNOSIS — E785 Hyperlipidemia, unspecified: Secondary | ICD-10-CM | POA: Diagnosis not present

## 2021-01-03 DIAGNOSIS — Z780 Asymptomatic menopausal state: Secondary | ICD-10-CM | POA: Diagnosis not present

## 2021-01-03 DIAGNOSIS — F329 Major depressive disorder, single episode, unspecified: Secondary | ICD-10-CM | POA: Diagnosis not present

## 2021-01-03 DIAGNOSIS — Z0000 Encounter for general adult medical examination without abnormal findings: Secondary | ICD-10-CM | POA: Diagnosis not present

## 2021-01-03 DIAGNOSIS — C44601 Unspecified malignant neoplasm of skin of unspecified upper limb, including shoulder: Secondary | ICD-10-CM | POA: Diagnosis not present

## 2021-01-06 ENCOUNTER — Other Ambulatory Visit: Payer: Self-pay | Admitting: Internal Medicine

## 2021-01-06 DIAGNOSIS — E785 Hyperlipidemia, unspecified: Secondary | ICD-10-CM

## 2021-01-08 DIAGNOSIS — M170 Bilateral primary osteoarthritis of knee: Secondary | ICD-10-CM | POA: Diagnosis not present

## 2021-01-08 DIAGNOSIS — M1711 Unilateral primary osteoarthritis, right knee: Secondary | ICD-10-CM | POA: Diagnosis not present

## 2021-02-11 ENCOUNTER — Ambulatory Visit
Admission: RE | Admit: 2021-02-11 | Discharge: 2021-02-11 | Disposition: A | Discharge status: Home / Self Care | Payer: Medicare PPO | Source: Ambulatory Visit | Attending: Internal Medicine | Admitting: Internal Medicine

## 2021-02-11 DIAGNOSIS — H1045 Other chronic allergic conjunctivitis: Secondary | ICD-10-CM | POA: Diagnosis not present

## 2021-02-11 DIAGNOSIS — I700 Atherosclerosis of aorta: Secondary | ICD-10-CM | POA: Diagnosis not present

## 2021-02-11 DIAGNOSIS — R059 Cough, unspecified: Secondary | ICD-10-CM | POA: Diagnosis not present

## 2021-02-11 DIAGNOSIS — J3081 Allergic rhinitis due to animal (cat) (dog) hair and dander: Secondary | ICD-10-CM | POA: Diagnosis not present

## 2021-02-11 DIAGNOSIS — E785 Hyperlipidemia, unspecified: Secondary | ICD-10-CM

## 2021-02-11 DIAGNOSIS — J301 Allergic rhinitis due to pollen: Secondary | ICD-10-CM | POA: Diagnosis not present

## 2021-02-13 DIAGNOSIS — M8589 Other specified disorders of bone density and structure, multiple sites: Secondary | ICD-10-CM | POA: Diagnosis not present

## 2021-02-26 DIAGNOSIS — Z85828 Personal history of other malignant neoplasm of skin: Secondary | ICD-10-CM | POA: Diagnosis not present

## 2021-02-26 DIAGNOSIS — L814 Other melanin hyperpigmentation: Secondary | ICD-10-CM | POA: Diagnosis not present

## 2021-02-26 DIAGNOSIS — Z85820 Personal history of malignant melanoma of skin: Secondary | ICD-10-CM | POA: Diagnosis not present

## 2021-02-26 DIAGNOSIS — D692 Other nonthrombocytopenic purpura: Secondary | ICD-10-CM | POA: Diagnosis not present

## 2021-02-26 DIAGNOSIS — L821 Other seborrheic keratosis: Secondary | ICD-10-CM | POA: Diagnosis not present

## 2021-02-26 DIAGNOSIS — L570 Actinic keratosis: Secondary | ICD-10-CM | POA: Diagnosis not present

## 2021-02-26 DIAGNOSIS — C44519 Basal cell carcinoma of skin of other part of trunk: Secondary | ICD-10-CM | POA: Diagnosis not present

## 2021-04-29 DIAGNOSIS — M25512 Pain in left shoulder: Secondary | ICD-10-CM | POA: Diagnosis not present

## 2021-05-05 DIAGNOSIS — M25512 Pain in left shoulder: Secondary | ICD-10-CM | POA: Diagnosis not present

## 2021-05-10 DIAGNOSIS — Z23 Encounter for immunization: Secondary | ICD-10-CM | POA: Diagnosis not present

## 2021-05-12 DIAGNOSIS — M25512 Pain in left shoulder: Secondary | ICD-10-CM | POA: Diagnosis not present

## 2021-05-21 DIAGNOSIS — M25512 Pain in left shoulder: Secondary | ICD-10-CM | POA: Diagnosis not present

## 2021-05-28 DIAGNOSIS — M25512 Pain in left shoulder: Secondary | ICD-10-CM | POA: Diagnosis not present

## 2021-06-02 DIAGNOSIS — M25512 Pain in left shoulder: Secondary | ICD-10-CM | POA: Diagnosis not present

## 2021-06-09 DIAGNOSIS — M25512 Pain in left shoulder: Secondary | ICD-10-CM | POA: Diagnosis not present

## 2021-06-11 DIAGNOSIS — Z947 Corneal transplant status: Secondary | ICD-10-CM | POA: Diagnosis not present

## 2021-06-11 DIAGNOSIS — Z961 Presence of intraocular lens: Secondary | ICD-10-CM | POA: Diagnosis not present

## 2021-06-11 DIAGNOSIS — Z8669 Personal history of other diseases of the nervous system and sense organs: Secondary | ICD-10-CM | POA: Diagnosis not present

## 2021-06-16 DIAGNOSIS — M25512 Pain in left shoulder: Secondary | ICD-10-CM | POA: Diagnosis not present

## 2021-06-24 DIAGNOSIS — M25512 Pain in left shoulder: Secondary | ICD-10-CM | POA: Diagnosis not present

## 2021-07-07 DIAGNOSIS — M25512 Pain in left shoulder: Secondary | ICD-10-CM | POA: Diagnosis not present

## 2021-07-21 DIAGNOSIS — M25512 Pain in left shoulder: Secondary | ICD-10-CM | POA: Diagnosis not present

## 2021-08-05 DIAGNOSIS — M25512 Pain in left shoulder: Secondary | ICD-10-CM | POA: Diagnosis not present

## 2021-08-12 DIAGNOSIS — M25512 Pain in left shoulder: Secondary | ICD-10-CM | POA: Diagnosis not present

## 2021-08-19 DIAGNOSIS — M25512 Pain in left shoulder: Secondary | ICD-10-CM | POA: Diagnosis not present

## 2021-08-21 DIAGNOSIS — L570 Actinic keratosis: Secondary | ICD-10-CM | POA: Diagnosis not present

## 2021-08-21 DIAGNOSIS — L821 Other seborrheic keratosis: Secondary | ICD-10-CM | POA: Diagnosis not present

## 2021-08-21 DIAGNOSIS — D225 Melanocytic nevi of trunk: Secondary | ICD-10-CM | POA: Diagnosis not present

## 2021-08-21 DIAGNOSIS — L814 Other melanin hyperpigmentation: Secondary | ICD-10-CM | POA: Diagnosis not present

## 2021-08-21 DIAGNOSIS — Z85828 Personal history of other malignant neoplasm of skin: Secondary | ICD-10-CM | POA: Diagnosis not present

## 2021-08-21 DIAGNOSIS — Z85820 Personal history of malignant melanoma of skin: Secondary | ICD-10-CM | POA: Diagnosis not present

## 2021-08-21 DIAGNOSIS — L700 Acne vulgaris: Secondary | ICD-10-CM | POA: Diagnosis not present

## 2021-08-21 DIAGNOSIS — L720 Epidermal cyst: Secondary | ICD-10-CM | POA: Diagnosis not present

## 2021-08-21 DIAGNOSIS — D692 Other nonthrombocytopenic purpura: Secondary | ICD-10-CM | POA: Diagnosis not present

## 2021-08-26 DIAGNOSIS — M25512 Pain in left shoulder: Secondary | ICD-10-CM | POA: Diagnosis not present

## 2021-09-02 DIAGNOSIS — L821 Other seborrheic keratosis: Secondary | ICD-10-CM | POA: Diagnosis not present

## 2021-09-02 DIAGNOSIS — M25512 Pain in left shoulder: Secondary | ICD-10-CM | POA: Diagnosis not present

## 2021-09-02 DIAGNOSIS — Z85820 Personal history of malignant melanoma of skin: Secondary | ICD-10-CM | POA: Diagnosis not present

## 2021-09-02 DIAGNOSIS — Z85828 Personal history of other malignant neoplasm of skin: Secondary | ICD-10-CM | POA: Diagnosis not present

## 2021-09-02 DIAGNOSIS — L111 Transient acantholytic dermatosis [Grover]: Secondary | ICD-10-CM | POA: Diagnosis not present

## 2021-09-09 DIAGNOSIS — M25512 Pain in left shoulder: Secondary | ICD-10-CM | POA: Diagnosis not present

## 2021-09-16 DIAGNOSIS — M25512 Pain in left shoulder: Secondary | ICD-10-CM | POA: Diagnosis not present

## 2021-09-23 DIAGNOSIS — M25512 Pain in left shoulder: Secondary | ICD-10-CM | POA: Diagnosis not present

## 2021-09-30 DIAGNOSIS — M25512 Pain in left shoulder: Secondary | ICD-10-CM | POA: Diagnosis not present

## 2021-10-07 DIAGNOSIS — M25512 Pain in left shoulder: Secondary | ICD-10-CM | POA: Diagnosis not present

## 2021-10-21 DIAGNOSIS — M25512 Pain in left shoulder: Secondary | ICD-10-CM | POA: Diagnosis not present

## 2021-10-28 DIAGNOSIS — M25512 Pain in left shoulder: Secondary | ICD-10-CM | POA: Diagnosis not present

## 2021-11-04 DIAGNOSIS — M25512 Pain in left shoulder: Secondary | ICD-10-CM | POA: Diagnosis not present

## 2021-11-25 DIAGNOSIS — M25512 Pain in left shoulder: Secondary | ICD-10-CM | POA: Diagnosis not present

## 2021-12-02 DIAGNOSIS — M25512 Pain in left shoulder: Secondary | ICD-10-CM | POA: Diagnosis not present

## 2021-12-09 DIAGNOSIS — M25512 Pain in left shoulder: Secondary | ICD-10-CM | POA: Diagnosis not present

## 2021-12-16 DIAGNOSIS — M25512 Pain in left shoulder: Secondary | ICD-10-CM | POA: Diagnosis not present

## 2021-12-23 DIAGNOSIS — M25512 Pain in left shoulder: Secondary | ICD-10-CM | POA: Diagnosis not present

## 2021-12-27 DIAGNOSIS — M25512 Pain in left shoulder: Secondary | ICD-10-CM | POA: Diagnosis not present

## 2021-12-30 DIAGNOSIS — M25512 Pain in left shoulder: Secondary | ICD-10-CM | POA: Diagnosis not present

## 2022-01-06 DIAGNOSIS — M25512 Pain in left shoulder: Secondary | ICD-10-CM | POA: Diagnosis not present

## 2022-01-12 DIAGNOSIS — M25561 Pain in right knee: Secondary | ICD-10-CM | POA: Diagnosis not present

## 2022-01-12 DIAGNOSIS — M25562 Pain in left knee: Secondary | ICD-10-CM | POA: Diagnosis not present

## 2022-01-13 DIAGNOSIS — M25512 Pain in left shoulder: Secondary | ICD-10-CM | POA: Diagnosis not present

## 2022-01-20 DIAGNOSIS — Z1231 Encounter for screening mammogram for malignant neoplasm of breast: Secondary | ICD-10-CM | POA: Diagnosis not present

## 2022-01-27 DIAGNOSIS — M25512 Pain in left shoulder: Secondary | ICD-10-CM | POA: Diagnosis not present

## 2022-02-17 DIAGNOSIS — L570 Actinic keratosis: Secondary | ICD-10-CM | POA: Diagnosis not present

## 2022-02-17 DIAGNOSIS — C4441 Basal cell carcinoma of skin of scalp and neck: Secondary | ICD-10-CM | POA: Diagnosis not present

## 2022-02-17 DIAGNOSIS — Z85820 Personal history of malignant melanoma of skin: Secondary | ICD-10-CM | POA: Diagnosis not present

## 2022-02-17 DIAGNOSIS — M25512 Pain in left shoulder: Secondary | ICD-10-CM | POA: Diagnosis not present

## 2022-02-17 DIAGNOSIS — L814 Other melanin hyperpigmentation: Secondary | ICD-10-CM | POA: Diagnosis not present

## 2022-02-17 DIAGNOSIS — D0472 Carcinoma in situ of skin of left lower limb, including hip: Secondary | ICD-10-CM | POA: Diagnosis not present

## 2022-02-17 DIAGNOSIS — L111 Transient acantholytic dermatosis [Grover]: Secondary | ICD-10-CM | POA: Diagnosis not present

## 2022-02-17 DIAGNOSIS — L821 Other seborrheic keratosis: Secondary | ICD-10-CM | POA: Diagnosis not present

## 2022-02-17 DIAGNOSIS — L820 Inflamed seborrheic keratosis: Secondary | ICD-10-CM | POA: Diagnosis not present

## 2022-02-17 DIAGNOSIS — Z85828 Personal history of other malignant neoplasm of skin: Secondary | ICD-10-CM | POA: Diagnosis not present

## 2022-02-18 DIAGNOSIS — J3081 Allergic rhinitis due to animal (cat) (dog) hair and dander: Secondary | ICD-10-CM | POA: Diagnosis not present

## 2022-02-18 DIAGNOSIS — H1045 Other chronic allergic conjunctivitis: Secondary | ICD-10-CM | POA: Diagnosis not present

## 2022-02-18 DIAGNOSIS — R7989 Other specified abnormal findings of blood chemistry: Secondary | ICD-10-CM | POA: Diagnosis not present

## 2022-02-18 DIAGNOSIS — M8580 Other specified disorders of bone density and structure, unspecified site: Secondary | ICD-10-CM | POA: Diagnosis not present

## 2022-02-18 DIAGNOSIS — E785 Hyperlipidemia, unspecified: Secondary | ICD-10-CM | POA: Diagnosis not present

## 2022-02-18 DIAGNOSIS — R052 Subacute cough: Secondary | ICD-10-CM | POA: Diagnosis not present

## 2022-02-18 DIAGNOSIS — J301 Allergic rhinitis due to pollen: Secondary | ICD-10-CM | POA: Diagnosis not present

## 2022-02-24 DIAGNOSIS — M25512 Pain in left shoulder: Secondary | ICD-10-CM | POA: Diagnosis not present

## 2022-02-25 DIAGNOSIS — Z1331 Encounter for screening for depression: Secondary | ICD-10-CM | POA: Diagnosis not present

## 2022-02-25 DIAGNOSIS — I2510 Atherosclerotic heart disease of native coronary artery without angina pectoris: Secondary | ICD-10-CM | POA: Diagnosis not present

## 2022-02-25 DIAGNOSIS — R1310 Dysphagia, unspecified: Secondary | ICD-10-CM | POA: Diagnosis not present

## 2022-02-25 DIAGNOSIS — R82998 Other abnormal findings in urine: Secondary | ICD-10-CM | POA: Diagnosis not present

## 2022-02-25 DIAGNOSIS — Z0000 Encounter for general adult medical examination without abnormal findings: Secondary | ICD-10-CM | POA: Diagnosis not present

## 2022-02-25 DIAGNOSIS — Z1339 Encounter for screening examination for other mental health and behavioral disorders: Secondary | ICD-10-CM | POA: Diagnosis not present

## 2022-02-25 DIAGNOSIS — E785 Hyperlipidemia, unspecified: Secondary | ICD-10-CM | POA: Diagnosis not present

## 2022-02-25 DIAGNOSIS — Z0389 Encounter for observation for other suspected diseases and conditions ruled out: Secondary | ICD-10-CM | POA: Diagnosis not present

## 2022-03-03 DIAGNOSIS — M25512 Pain in left shoulder: Secondary | ICD-10-CM | POA: Diagnosis not present

## 2022-03-17 DIAGNOSIS — M25512 Pain in left shoulder: Secondary | ICD-10-CM | POA: Diagnosis not present

## 2022-03-23 DIAGNOSIS — C4441 Basal cell carcinoma of skin of scalp and neck: Secondary | ICD-10-CM | POA: Diagnosis not present

## 2022-03-23 DIAGNOSIS — Z85820 Personal history of malignant melanoma of skin: Secondary | ICD-10-CM | POA: Diagnosis not present

## 2022-03-23 DIAGNOSIS — Z85828 Personal history of other malignant neoplasm of skin: Secondary | ICD-10-CM | POA: Diagnosis not present

## 2022-04-01 DIAGNOSIS — M25512 Pain in left shoulder: Secondary | ICD-10-CM | POA: Diagnosis not present

## 2022-04-15 DIAGNOSIS — M25512 Pain in left shoulder: Secondary | ICD-10-CM | POA: Diagnosis not present

## 2022-04-29 DIAGNOSIS — M25512 Pain in left shoulder: Secondary | ICD-10-CM | POA: Diagnosis not present

## 2022-04-30 DIAGNOSIS — Z23 Encounter for immunization: Secondary | ICD-10-CM | POA: Diagnosis not present

## 2022-05-09 DIAGNOSIS — Z23 Encounter for immunization: Secondary | ICD-10-CM | POA: Diagnosis not present

## 2022-05-13 DIAGNOSIS — M25512 Pain in left shoulder: Secondary | ICD-10-CM | POA: Diagnosis not present

## 2022-05-26 DIAGNOSIS — E785 Hyperlipidemia, unspecified: Secondary | ICD-10-CM | POA: Diagnosis not present

## 2022-05-26 DIAGNOSIS — R7989 Other specified abnormal findings of blood chemistry: Secondary | ICD-10-CM | POA: Diagnosis not present

## 2022-05-27 DIAGNOSIS — M25512 Pain in left shoulder: Secondary | ICD-10-CM | POA: Diagnosis not present

## 2022-06-09 DIAGNOSIS — M25512 Pain in left shoulder: Secondary | ICD-10-CM | POA: Diagnosis not present

## 2022-06-16 DIAGNOSIS — M25512 Pain in left shoulder: Secondary | ICD-10-CM | POA: Diagnosis not present

## 2022-06-23 DIAGNOSIS — M25512 Pain in left shoulder: Secondary | ICD-10-CM | POA: Diagnosis not present

## 2022-06-23 DIAGNOSIS — M25511 Pain in right shoulder: Secondary | ICD-10-CM | POA: Diagnosis not present

## 2022-07-07 DIAGNOSIS — M25519 Pain in unspecified shoulder: Secondary | ICD-10-CM | POA: Diagnosis not present

## 2022-07-21 ENCOUNTER — Encounter: Payer: Self-pay | Admitting: *Deleted

## 2022-07-22 DIAGNOSIS — Z961 Presence of intraocular lens: Secondary | ICD-10-CM | POA: Diagnosis not present

## 2022-07-22 DIAGNOSIS — Z947 Corneal transplant status: Secondary | ICD-10-CM | POA: Diagnosis not present

## 2022-07-22 DIAGNOSIS — Z8669 Personal history of other diseases of the nervous system and sense organs: Secondary | ICD-10-CM | POA: Diagnosis not present

## 2022-08-04 DIAGNOSIS — M25519 Pain in unspecified shoulder: Secondary | ICD-10-CM | POA: Diagnosis not present

## 2022-08-05 ENCOUNTER — Ambulatory Visit (INDEPENDENT_AMBULATORY_CARE_PROVIDER_SITE_OTHER): Payer: Medicare PPO | Admitting: Internal Medicine

## 2022-08-05 ENCOUNTER — Encounter: Payer: Self-pay | Admitting: Internal Medicine

## 2022-08-05 VITALS — BP 110/68 | Ht 66.5 in | Wt 150.5 lb

## 2022-08-05 DIAGNOSIS — R1319 Other dysphagia: Secondary | ICD-10-CM | POA: Diagnosis not present

## 2022-08-05 DIAGNOSIS — R079 Chest pain, unspecified: Secondary | ICD-10-CM

## 2022-08-05 DIAGNOSIS — K219 Gastro-esophageal reflux disease without esophagitis: Secondary | ICD-10-CM

## 2022-08-05 NOTE — Patient Instructions (Addendum)
Continue Famotidine 20 mg twice daily.   You have been referred to Dr. Einar Gip . His office will contact you to schedule appointment. If you have not heard from his office in 1-2 weeks, please contact his office at :   You will be contaced by our office prior to your procedure regarding cardiac clearance. If you do not hear from our office 3 week prior to your scheduled procedure, please call [phone] to discuss. IF you have not been cleared by Cardiology we will need to postpone procedure.   You will be due for a recall colonoscopy in Jan 2025. We will send you a reminder in the mail when it gets closer to that time.  You have been scheduled for an endoscopy. Please follow written instructions given to you at your visit today. If you use inhalers (even only as needed), please bring them with you on the day of your procedure.  Due to recent changes in healthcare laws, you may see the results of your imaging and laboratory studies on MyChart before your provider has had a chance to review them.  We understand that in some cases there may be results that are confusing or concerning to you. Not all laboratory results come back in the same time frame and the provider may be waiting for multiple results in order to interpret others.  Please give [place] 48 hours in order for your provider to thoroughly review all the results before contacting the office for clarification of your results.   Thank you for choosing me and [facility].  Dr. Ulice Dash Pyrtle

## 2022-08-05 NOTE — Progress Notes (Signed)
Patient ID: Sherri Morris, female   DOB: [birth year], 77 y.o.   MRN: [id number] HPI: Sherri Morris is a 77 year old female with a past medical history of vitamin D deficiency, osteopenia, GERD, melanoma, hyperlipidemia who is seen in consult at the request of Dr. Joylene Draft to evaluate dysphagia and chest pain.  She is here alone today.  Her previous GI care had been with Dr. Olevia Perches.  Her last colonoscopy was normal on 08/18/2013.  She reports that she has had 2 episodes of chest pain over the last 2 years.  The first episode was 2 years ago where she developed intense chest pressure and the feeling like a "elephant was sitting on my chest".  She felt faint but did not lose consciousness.  EMS was called and she was evaluated at home but not transported to the hospital.  This episode resolved.  She had another similar though less severe episode of chest pressure in December 2023.  Both episodes have occurred on an empty stomach.  No chest pain today.  Dr. Harold Hedge her allergist started famotidine 20 mg which she is using twice daily.  This was started for cough which was felt to be a reflux symptom.  She has been hesitant for other prescription therapies and has never used PPI.  She saw Dr. Einar Gip after the episode of chest pain 2+ years ago and recalls being told this was likely not cardiac chest pain.  She recalls EKG but does not recall a stress test or other formal testing.  She will infrequently have solid food dysphagia.  This happens almost exclusively if she eats chicken.  The food simply does not go down and she has to wait for it to pass the esophagus.  She does not have to bring this back up.  This is very infrequent and not progressive.  She is not feeling heartburn symptom.  No nausea or vomiting.  Good appetite.  Normal bowel movements without blood in stool or melena.  She smokes a cigarette usually at night but can go multiple days without a cigarette at all.  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  Enjoys traveling with her family.  Did start rosuvastatin per Dr. Joylene Draft based on her cholesterol panel and elevated cardiac calcium score.  She does have a family history of CAD.  Past Medical History:  Diagnosis Date   Allergy    enviromental   Arthritis    CAD (coronary artery disease)    Cancer (HCC)    skin    Major depressive disorder    Osteopenia    Shingles    Skin cancer    Skin melanoma ([place])    Vitamin D insufficiency     Past Surgical History:  Procedure Laterality Date   CHOLECYSTECTOMY     CORNEAL TRANSPLANT Right 08/03/2010   CORNEAL TRANSPLANT Left 06/2020   FOOT FRACTURE SURGERY Left 08/04/2011   MELANOMA EXCISION  08/04/2011   ROTATOR CUFF REPAIR  12/2019   TONSILLECTOMY AND ADENOIDECTOMY      Outpatient Medications Prior to Visit  Medication Sig Dispense Refill   acetaminophen (TYLENOL) 500 MG tablet Take 500 mg by mouth as needed.     AMBULATORY NON FORMULARY MEDICATION Place 1 spray into both nostrils daily in the afternoon. Medication Name: Sensa-Mist     estrogen, conjugated,-medroxyprogesterone (PREMPRO) 0.3-1.5 MG per tablet Take 1 tablet by mouth daily.  famotidine (PEPCID) 20 MG tablet Take 20 mg by mouth 2 (two) times daily.     ibuprofen (ADVIL) 200 MG tablet Take 200 mg by mouth as needed.     Multiple Vitamin (MULTI VITAMIN DAILY PO) Take 1 tablet by mouth.     rosuvastatin (CRESTOR) 10 MG tablet Take 10 mg by mouth daily.     Turmeric 500 MG CAPS Take 1,000 mg by mouth daily.     vitamin C (ASCORBIC ACID) 500 MG tablet Take 500 mg by mouth daily.     betamethasone dipropionate 0.05 % cream betamethasone dipropionate 0.05 % topical cream  APPLY 1 APPLICATION ON THE SKIN TWICE A DAY     Calcium Carb-Cholecalciferol 600-800 MG-UNIT TABS Take 1 tablet by mouth.     Calcium Carbonate-Vitamin D 600-200 MG-UNIT TABS Take 1 tablet by mouth daily.      doxycycline (VIBRA-TABS) 100 MG tablet Take 1 tablet (100 mg total) by mouth 2 (two) times daily. 14 tablet 0   fexofenadine (ALLEGRA) 60 MG tablet Take 60 mg by mouth daily.     fluticasone (FLONASE) 50 MCG/ACT nasal spray Place into both nostrils daily.     fluticasone (VERAMYST) 27.5 MCG/SPRAY nasal spray Use 1 spray in each nostril once daily in the morning     gentamicin cream (GARAMYCIN) 0.1 % Apply 1 application topically 2 (two) times daily. 15 g 1   ketotifen (ZADITOR) 0.025 % ophthalmic solution 1 drop into affected eye     montelukast (SINGULAIR) 10 MG tablet montelukast 10 mg tablet     naproxen (NAPROSYN) 500 MG tablet      neomycin-polymyxin-dexameth (MAXITROL) 0.1 % OINT as needed.     ondansetron (ZOFRAN) 4 MG tablet      prednisoLONE acetate (PRED FORTE) 1 % ophthalmic suspension prednisolone acetate 1 % eye drops,suspension  INSTILL 1 DROP INTO RIGHT EYE 4 TIMES A DAY     Specialty Vitamins Products (COLLAGEN ULTRA) CAPS See admin instructions.     No facility-administered medications prior to visit.    No Known Allergies  Family History  Problem Relation Age of Onset   Stroke Mother    Diabetes Mellitus II Mother    Hypertension Mother    Heart attack Father    Heart disease Father    Angina Father    Hypertension Father    Peripheral vascular disease Father    Hypertension Brother    Hypertension Brother    Hypertension Brother    Colon cancer Neg Hx     Social History   Tobacco Use   Smoking status: Some Days    Types: Cigarettes   Smokeless tobacco: Never   Tobacco comments:    1 CIG A DAY  Vaping Use   Vaping Use: Never used  Substance Use Topics   Alcohol use: Yes    Alcohol/week: 5.0 standard drinks of alcohol    Types: 5 Standard drinks or equivalent per week    Comment: WINE   Drug use: No    ROS: As per history of present illness, otherwise negative  BP 110/68   Ht 5' 6.5" (1.689 m)   Wt 150 lb 8 oz (68.3 kg)   BMI 23.93 kg/m   Gen: awake, alert, NAD HEENT: anicteric  CV: RRR, no mrg Pulm: CTA b/l Abd: soft, NT/ND, +BS throughout Ext: no c/c/e Neuro: nonfocal   RELEVANT LABS AND IMAGING: Labs per Dr. Lenox Ponds notes. CBC 02/18/2022; hemoglobin 13.6, MCV 93.3, white count 4.0, platelet count  22 CMP at the same date normal TSH 1.29 Vitamin D 45.9  ASSESSMENT/PLAN:  77 year old female with a past medical history of vitamin D deficiency, osteopenia, GERD, melanoma, hyperlipidemia who is seen in consult at the request of Dr. Joylene Draft to evaluate dysphagia and chest pain.   Chest pain --episodic x 2.  I am concerned that this was angina, though esophageal spasm is a possibility.  Given her age, tobacco use history, elevated lipid panels, cardiac calcium score and family history I would like her to see Dr. Einar Gip again to have additional cardiac evaluation and testing if he deems appropriate.  While this may be esophageal, cardiac needs to be excluded first. -- Referral back to see Dr. Einar Gip  2.  History of GERD and esophageal dysphagia --upper endoscopy is certainly warranted.  She is not having heartburn symptoms but may have an esophageal stricture.  We discussed the risk, benefits and alternatives and she is agreeable and wishes to proceed.  This needs to be after complete cardiac evaluation and clearance -- Upper endoscopy in the [facility] after cardiac evaluation, see #1 -- For now continue famotidine 20 mg twice daily  3.  Colon cancer screening --normal colonoscopy 9 years ago this month.  Repeat screening colonoscopy recommended January 2025      BZ:JIRCVE, [address], [address],  [place] [address]

## 2022-08-11 DIAGNOSIS — S8011XA Contusion of right lower leg, initial encounter: Secondary | ICD-10-CM | POA: Diagnosis not present

## 2022-08-11 DIAGNOSIS — T148XXA Other injury of unspecified body region, initial encounter: Secondary | ICD-10-CM | POA: Diagnosis not present

## 2022-08-18 ENCOUNTER — Ambulatory Visit: Payer: Medicare PPO | Admitting: Internal Medicine

## 2022-08-18 ENCOUNTER — Ambulatory Visit: Payer: Medicare Other | Admitting: Internal Medicine

## 2022-08-18 ENCOUNTER — Encounter: Payer: Self-pay | Admitting: Internal Medicine

## 2022-08-18 VITALS — BP 130/74 | HR 60 | Ht 66.5 in | Wt 150.0 lb

## 2022-08-18 DIAGNOSIS — I209 Angina pectoris, unspecified: Secondary | ICD-10-CM | POA: Diagnosis not present

## 2022-08-18 DIAGNOSIS — R079 Chest pain, unspecified: Secondary | ICD-10-CM | POA: Diagnosis not present

## 2022-08-18 DIAGNOSIS — Z01818 Encounter for other preprocedural examination: Secondary | ICD-10-CM

## 2022-08-18 DIAGNOSIS — M25519 Pain in unspecified shoulder: Secondary | ICD-10-CM | POA: Diagnosis not present

## 2022-08-18 DIAGNOSIS — E782 Mixed hyperlipidemia: Secondary | ICD-10-CM

## 2022-08-19 NOTE — Progress Notes (Signed)
Primary Physician/Referring:  Crist Infante, MD  Patient ID: Sherri Morris, female    DOB: Jul 25, 1946, 77 y.o.   MRN: [id number]  Chief Complaint  Patient presents with   Chest Pain   Medical Clearance   New Patient (Initial Visit)        HPI:    Sherri Morris  is a 77 y.o. female with past medical history significant for GERD who is here to establish care with cardiology due to chest pain.  Patient was supposed to have a GI procedure done however she had a few episodes of chest pain and tightness so she was referred here for further evaluation.  Patient was seen here many years ago due to significant family history of heart disease at which time no coronary disease was found.  Patient denies shortness of breath, palpitations, diaphoresis, syncope, orthopnea, edema, PND.  Her GI doctor would like to exclude angina prior to proceeding with scope.  Patient does have a difficult time distinguishing between her GERD and her angina and it has been many years since she had a cardiac workup.  Past Medical History:  Diagnosis Date   Allergy    enviromental   Arthritis    CAD (coronary artery disease)    Cancer (HCC)    skin    Major depressive disorder    Osteopenia    Shingles    Skin cancer    Skin melanoma ([place])    Vitamin D insufficiency    Past Surgical History:  Procedure Laterality Date   CHOLECYSTECTOMY     CORNEAL TRANSPLANT Right 08/03/2010   CORNEAL TRANSPLANT Left 06/2020   FOOT FRACTURE SURGERY Left 08/04/2011   MELANOMA EXCISION  08/04/2011   ROTATOR CUFF REPAIR  12/2019   TONSILLECTOMY AND ADENOIDECTOMY     Family History  Problem Relation Age of Onset   Stroke Mother    Diabetes Mellitus II Mother    Hypertension Mother    Heart attack Father    Heart disease Father    Angina Father    Hypertension Father    Peripheral vascular disease Father    Hypertension Brother    Hypertension Brother    Hypertension Brother    Colon cancer Neg Hx     Social  History   Tobacco Use   Smoking status: Some Days    Types: Cigarettes   Smokeless tobacco: Never   Tobacco comments:    1 CIG A DAY  Substance Use Topics   Alcohol use: Yes    Alcohol/week: 5.0 standard drinks of alcohol    Types: 5 Standard drinks or equivalent per week    Comment: WINE   Marital Status: Married  ROS  Review of Systems  Cardiovascular:  Positive for chest pain.  Gastrointestinal:  Positive for heartburn.   Objective  Blood pressure 130/74, pulse 60, height 5' 6.5" (1.689 m), weight 150 lb (68 kg), SpO2 97 %. Body mass index is 23.85 kg/m.     08/18/2022    1:34 PM 08/18/2022    1:27 PM 08/05/2022   10:37 AM  Vitals with BMI  Height  5' 6.5" 5' 6.5"  Weight  150 lbs 150 lbs 8 oz  BMI  45.62 56.38  Systolic 937 342 876  Diastolic 74 63 68  Pulse 60 68      Physical Exam Vitals reviewed.  HENT:     Head: Normocephalic and atraumatic.  Cardiovascular:     Rate and Rhythm: Normal rate and  regular rhythm.     Pulses: Normal pulses.     Heart sounds: Normal heart sounds. No murmur heard. Pulmonary:     Effort: Pulmonary effort is normal.     Breath sounds: Normal breath sounds.  Abdominal:     General: Bowel sounds are normal.  Musculoskeletal:     Right lower leg: No edema.     Left lower leg: No edema.  Skin:    General: Skin is warm and dry.  Neurological:     Mental Status: She is alert.     Medications and allergies  No Known Allergies   Medication list after today's encounter   Current Outpatient Medications:    acetaminophen (TYLENOL) 500 MG tablet, Take 500 mg by mouth as needed., Disp: , Rfl:    AMBULATORY NON FORMULARY MEDICATION, Place 1 spray into both nostrils daily in the afternoon. Medication Name: Sensa-Mist, Disp: , Rfl:    estrogen, conjugated,-medroxyprogesterone (PREMPRO) 0.3-1.5 MG per tablet, Take 1 tablet by mouth daily., Disp: , Rfl:    famotidine (PEPCID) 20 MG tablet, Take 20 mg by mouth 2 (two) times daily., Disp:  , Rfl:    ibuprofen (ADVIL) 200 MG tablet, Take 200 mg by mouth as needed., Disp: , Rfl:    Multiple Vitamin (MULTI VITAMIN DAILY PO), Take 1 tablet by mouth., Disp: , Rfl:    rosuvastatin (CRESTOR) 10 MG tablet, Take 10 mg by mouth daily., Disp: , Rfl:    Turmeric 500 MG CAPS, Take 1,000 mg by mouth daily., Disp: , Rfl:    vitamin C (ASCORBIC ACID) 500 MG tablet, Take 500 mg by mouth daily., Disp: , Rfl:   Laboratory examination:   No results found for: "NA", "K", "CO2", "GLUCOSE", "BUN", "CREATININE", "CALCIUM", "EGFR", "GFRNONAA"      No data to display             No data to display          Lipid Panel No results for input(s): "CHOL", "TRIG", "LDLCALC", "VLDL", "HDL", "CHOLHDL", "LDLDIRECT" in the last 8760 hours.  HEMOGLOBIN A1C No results found for: "HGBA1C", "MPG" TSH No results for input(s): "TSH" in the last 8760 hours.  External labs:   05/2022 labs: ApoB = 71 Total cholesterol 160 TG 68 HDL 74 LDL 72   Radiology:    Cardiac Studies:   CT angiogram chest 11/28/2018: Normal. No evidence of PE, cardiac structures normal. No abnormality is detected.   EKG:   08/18/2022: Sinus Bradycardia with iRBBB. Low voltage in precordial leads. No significant change compared to prior  EKG 12/19/2018: Sinus bradycardia at rate of 54 bpm, normal axis. Incomplete right bundle branch block. Otherwise normal EKG. No change from EKG EKG 11/27/18.   Assessment     ICD-10-CM   1. Chest pain, mixed picture  R07.9 EKG 12-Lead    PCV ECHOCARDIOGRAM COMPLETE    PCV MYOCARDIAL PERFUSION WO LEXISCAN    2. Mixed hyperlipidemia  E78.2     3. Angina pectoris (HCC)  I20.9 PCV ECHOCARDIOGRAM COMPLETE    PCV MYOCARDIAL PERFUSION WO LEXISCAN    4. Pre-op evaluation  Z01.818        Orders Placed This Encounter  Procedures   PCV MYOCARDIAL PERFUSION WO LEXISCAN    Standing Status:   Future    Standing Expiration Date:   10/17/2022   EKG 12-Lead   PCV ECHOCARDIOGRAM COMPLETE     Standing Status:   Future    Standing Expiration Date:  08/19/2023    No orders of the defined types were placed in this encounter.   There are no discontinued medications.   Recommendations:   Sherri Morris is a 77 y.o.  female with chest pain   Mixed hyperlipidemia Continue crestor Primary following lipids Well controlled, last LDL 72 in 05/2022  Angina pectoris ([place]) Echo and stress test ordered Potentially could be GERD related however given her family history and persistent episodes of chest pain, ischemia needs to be ruled out   Pre-op evaluation She will be undergoing GI procedure and they are requesting cardiac risk stratification before proceeding given her chest pain  Follow-up in 1-2 months or sooner if needed   Floydene Flock, DO, [facility]  08/19/2022, 12:12 PM Office: [phone] Pager: [phone]

## 2022-08-19 NOTE — Progress Notes (Signed)
NA

## 2022-08-20 ENCOUNTER — Ambulatory Visit: Payer: Medicare PPO

## 2022-08-20 DIAGNOSIS — L570 Actinic keratosis: Secondary | ICD-10-CM | POA: Diagnosis not present

## 2022-08-20 DIAGNOSIS — L821 Other seborrheic keratosis: Secondary | ICD-10-CM | POA: Diagnosis not present

## 2022-08-20 DIAGNOSIS — R079 Chest pain, unspecified: Secondary | ICD-10-CM

## 2022-08-20 DIAGNOSIS — Z85820 Personal history of malignant melanoma of skin: Secondary | ICD-10-CM | POA: Diagnosis not present

## 2022-08-20 DIAGNOSIS — D225 Melanocytic nevi of trunk: Secondary | ICD-10-CM | POA: Diagnosis not present

## 2022-08-20 DIAGNOSIS — Z85828 Personal history of other malignant neoplasm of skin: Secondary | ICD-10-CM | POA: Diagnosis not present

## 2022-08-20 DIAGNOSIS — D2271 Melanocytic nevi of right lower limb, including hip: Secondary | ICD-10-CM | POA: Diagnosis not present

## 2022-08-20 DIAGNOSIS — I209 Angina pectoris, unspecified: Secondary | ICD-10-CM | POA: Diagnosis not present

## 2022-08-20 DIAGNOSIS — D0471 Carcinoma in situ of skin of right lower limb, including hip: Secondary | ICD-10-CM | POA: Diagnosis not present

## 2022-08-20 DIAGNOSIS — L814 Other melanin hyperpigmentation: Secondary | ICD-10-CM | POA: Diagnosis not present

## 2022-08-24 NOTE — Progress Notes (Signed)
normal

## 2022-08-24 NOTE — Progress Notes (Signed)
Called patient to inform her about her echo result. Patient understood

## 2022-09-01 ENCOUNTER — Ambulatory Visit: Payer: Medicare PPO

## 2022-09-01 DIAGNOSIS — R079 Chest pain, unspecified: Secondary | ICD-10-CM | POA: Diagnosis not present

## 2022-09-01 DIAGNOSIS — I209 Angina pectoris, unspecified: Secondary | ICD-10-CM | POA: Diagnosis not present

## 2022-09-04 DIAGNOSIS — M25519 Pain in unspecified shoulder: Secondary | ICD-10-CM | POA: Diagnosis not present

## 2022-09-04 NOTE — Progress Notes (Signed)
normal

## 2022-09-07 NOTE — Progress Notes (Signed)
Called patient, NA, left stress test results on VM.

## 2022-09-15 DIAGNOSIS — M25519 Pain in unspecified shoulder: Secondary | ICD-10-CM | POA: Diagnosis not present

## 2022-09-29 ENCOUNTER — Ambulatory Visit: Payer: Medicare PPO | Admitting: Internal Medicine

## 2022-09-29 DIAGNOSIS — M25519 Pain in unspecified shoulder: Secondary | ICD-10-CM | POA: Diagnosis not present

## 2022-10-04 ENCOUNTER — Encounter: Payer: Self-pay | Admitting: Certified Registered Nurse Anesthetist

## 2022-10-05 ENCOUNTER — Encounter: Payer: Self-pay | Admitting: Internal Medicine

## 2022-10-05 ENCOUNTER — Ambulatory Visit (AMBULATORY_SURGERY_CENTER): Payer: Medicare PPO | Admitting: Internal Medicine

## 2022-10-05 VITALS — BP 142/77 | HR 72 | Temp 98.0°F | Resp 13 | Ht 66.0 in | Wt 150.0 lb

## 2022-10-05 DIAGNOSIS — R1319 Other dysphagia: Secondary | ICD-10-CM

## 2022-10-05 DIAGNOSIS — K219 Gastro-esophageal reflux disease without esophagitis: Secondary | ICD-10-CM | POA: Diagnosis not present

## 2022-10-05 DIAGNOSIS — K229 Disease of esophagus, unspecified: Secondary | ICD-10-CM | POA: Diagnosis not present

## 2022-10-05 DIAGNOSIS — K222 Esophageal obstruction: Secondary | ICD-10-CM

## 2022-10-05 DIAGNOSIS — K2970 Gastritis, unspecified, without bleeding: Secondary | ICD-10-CM

## 2022-10-05 DIAGNOSIS — K31811 Angiodysplasia of stomach and duodenum with bleeding: Secondary | ICD-10-CM

## 2022-10-05 DIAGNOSIS — K3189 Other diseases of stomach and duodenum: Secondary | ICD-10-CM | POA: Diagnosis not present

## 2022-10-05 DIAGNOSIS — I2510 Atherosclerotic heart disease of native coronary artery without angina pectoris: Secondary | ICD-10-CM | POA: Diagnosis not present

## 2022-10-05 MED ORDER — SODIUM CHLORIDE 0.9 % IV SOLN: 500.0000 mL | Freq: Once | INTRAVENOUS | Status: DC

## 2022-10-05 NOTE — Op Note (Signed)
[facility] Patient Name: Sherri Morris Procedure Date: 10/05/2022 10:32 AM MRN: [id number] Endoscopist: Jerene Bears , MD, [id number] Age: 77 Referring MD:  Date of Birth: February 06, 1946 Gender: Female Account #: [id number] Procedure:                Upper GI endoscopy Indications:              Dysphagia, Heartburn, Unexplained chest pain                            (recent negative stress/ECHO), current famotidine                            20 mg BID, no prior PPI Medicines:                Monitored Anesthesia Care Procedure:                Pre-Anesthesia Assessment:                           - Prior to the procedure, a History and Physical                            was performed, and patient medications and                            allergies were reviewed. The patient's tolerance of                            previous anesthesia was also reviewed. The risks                            and benefits of the procedure and the sedation                            options and risks were discussed with the patient.                            All questions were answered, and informed consent                            was obtained. Prior Anticoagulants: The patient has                            taken no anticoagulant or antiplatelet agents. ASA                            Grade Assessment: III - A patient with severe                            systemic disease. After reviewing the risks and                            benefits, the patient was deemed in satisfactory  condition to undergo the procedure.                           After obtaining informed consent, the endoscope was                            passed under direct vision. Throughout the                            procedure, the patient's blood pressure, pulse, and                            oxygen saturations were monitored continuously. The                            GIF [id number] was  introduced through the                            mouth, and advanced to the second part of duodenum.                            The upper GI endoscopy was accomplished without                            difficulty. The patient tolerated the procedure                            well. Scope In: Scope Out: Findings:                 Diffuse mild inflammation characterized by                            congestion (edema) and granularity was found in the                            lower third of the esophagus. Biopsies were taken                            with a cold forceps for histology.                           One benign-appearing, intrinsic mild                            (non-circumferential scarring) stenosis was found                            at the gastroesophageal junction. This stenosis                            measured less than one cm (in length). The stenosis                            was traversed. A TTS dilator was passed through the  scope. Dilation with a 15-16.5-18 mm balloon                            dilator was performed to 18 mm.                           A single 4 mm angioectasia with bleeding was found                            on the lesser curvature of the gastric body. For                            hemostasis, one hemostatic clip was successfully                            placed (MR conditional). There was no bleeding at                            the end of the maneuver.                           Scattered mild inflammation characterized by                            erythema and granularity was found in the gastric                            body and in the gastric antrum. Biopsies were taken                            with a cold forceps for histology and Helicobacter                            pylori testing.                           The examined duodenum was normal. Complications:            No immediate  complications. Estimated Blood Loss:     Estimated blood loss: none. Impression:               - Esophageal mucosal changes were present,                            including congestion (edema) and granularity.                            Findings are suggestive of reflux inflammation.                            Biopsied.                           - Benign-appearing esophageal stenosis. Dilated to  18 mm with balloon.                           - A single bleeding angioectasia in the stomach.                            Clip (MR conditional) was placed (APC not available                            in outpatient ambulatory setting).                           - Mild gastritis. Biopsied to exclude H. Pylori                            infection.                           - Normal examined duodenum. Recommendation:           - Patient has a contact number available for                            emergencies. The signs and symptoms of potential                            delayed complications were discussed with the                            patient. Return to normal activities tomorrow.                            Written discharge instructions were provided to the                            patient.                           - Resume previous diet.                           - Continue present medications. For now continue                            famotidine 20 mg BID, but may need to consider PPI                            depending on biopsy results. Suspicion for                            uncontrolled acid reflux and intermittent                            esophageal spasm.                           - Await pathology  results.                           - Office follow-up with me, May or June 2024 Jerene Bears, MD 10/05/2022 10:56:11 AM This report has been signed electronically.

## 2022-10-05 NOTE — Progress Notes (Signed)
Report given to PACU, vss 

## 2022-10-05 NOTE — Patient Instructions (Addendum)
Await results for final recommendations.  Handouts on findings given to patient.                            - Resume previous medicine                          - Continue present medications. For now continue                            famotidine 20 mg BID, but may need to consider PPI                            depending on biopsy results. Suspicion for                            uncontrolled acid reflux and intermittent                            esophageal spasm.                           - Await pathology results.                           - Office follow-up with me, May or June 2024   YOU HAD AN ENDOSCOPIC PROCEDURE TODAY AT [facility]:   Refer to the procedure report that was given to you for any specific questions about what was found during the examination.  If the procedure report does not answer your questions, please call your gastroenterologist to clarify.  If you requested that your care partner not be given the details of your procedure findings, then the procedure report has been included in a sealed envelope for you to review at your convenience later.  YOU SHOULD EXPECT: Some feelings of bloating in the abdomen. Passage of more gas than usual.  Walking can help get rid of the air that was put into your GI tract during the procedure and reduce the bloating. If you had a lower endoscopy (such as a colonoscopy or flexible sigmoidoscopy) you may notice spotting of blood in your stool or on the toilet paper. If you underwent a bowel prep for your procedure, you may not have a normal bowel movement for a few days.  Please Note:  You might notice some irritation and congestion in your nose or some drainage.  This is from the oxygen used during your procedure.  There is no need for concern and it should clear up in a day or so.  SYMPTOMS TO REPORT IMMEDIATELY:  Following upper endoscopy (EGD)  Vomiting of blood or coffee ground material  New chest pain or pain under  the shoulder blades  Painful or persistently difficult swallowing  New shortness of breath  Fever of 100F or higher  Black, tarry-looking stools  For urgent or emergent issues, a gastroenterologist can be reached at any hour by calling [phone]. Do not use MyChart messaging for urgent concerns.    DIET:  We do recommend a small meal at first, but then you may proceed to your regular diet.  Drink plenty of fluids but you should avoid alcoholic beverages  for 24 hours.  ACTIVITY:  You should plan to take it easy for the rest of today and you should NOT DRIVE or use heavy machinery until tomorrow (because of the sedation medicines used during the test).    FOLLOW UP: Our staff will call the number listed on your records the next business day following your procedure.  We will call around 7:15- 8:00 am to check on you and address any questions or concerns that you may have regarding the information given to you following your procedure. If we do not reach you, we will leave a message.     If any biopsies were taken you will be contacted by phone or by letter within the next 1-3 weeks.  Please call us at [phone] if you have not heard about the biopsies in 3 weeks.    SIGNATURES/CONFIDENTIALITY: You and/or your care partner have signed paperwork which will be entered into your electronic medical record.  These signatures attest to the fact that that the information above on your After Visit Summary has been reviewed and is understood.  Full responsibility of the confidentiality of this discharge information lies with you and/or your care-partner.

## 2022-10-05 NOTE — Progress Notes (Signed)
Pt sat up on the side of the bed and felt light headed.  Laid pt back down and will raise head of bed in increments until upright.  BP retaken. 144/77.

## 2022-10-05 NOTE — Progress Notes (Signed)
GASTROENTEROLOGY PROCEDURE H&P NOTE   Primary Care Physician: Crist Infante, MD    Reason for Procedure:  History of GERD and esophageal dysphagia  Plan:    EGD  Patient is appropriate for endoscopic procedure(s) in the ambulatory ([facility]) setting.  The nature of the procedure, as well as the risks, benefits, and alternatives were carefully and thoroughly reviewed with the patient. Ample time for discussion and questions allowed. The patient understood, was satisfied, and agreed to proceed.     HPI: Sherri Morris is a 77 y.o. female who presents for EGD.  Medical history as below.  No recent chest pain or shortness of breath.  No abdominal pain today.  Past Medical History:  Diagnosis Date   Allergy    enviromental   Arthritis    CAD (coronary artery disease)    Cancer (HCC)    skin    Major depressive disorder    Osteopenia    Shingles    Skin cancer    Skin melanoma ([place])    Vitamin D insufficiency     Past Surgical History:  Procedure Laterality Date   CHOLECYSTECTOMY     CORNEAL TRANSPLANT Right 08/03/2010   CORNEAL TRANSPLANT Left 06/2020   FOOT FRACTURE SURGERY Left 08/04/2011   MELANOMA EXCISION  08/04/2011   ROTATOR CUFF REPAIR  12/2019   TONSILLECTOMY AND ADENOIDECTOMY      Prior to Admission medications   Medication Sig Start Date End Date Taking? Authorizing Provider  acetaminophen (TYLENOL) 500 MG tablet Take 500 mg by mouth as needed.   Yes [provider]  AMBULATORY NON FORMULARY MEDICATION Place 1 spray into both nostrils daily in the afternoon. Medication Name: Sensa-Mist   Yes [provider]  estrogen, conjugated,-medroxyprogesterone (PREMPRO) 0.3-1.5 MG per tablet Take 1 tablet by mouth daily.   Yes [provider]  famotidine (PEPCID) 20 MG tablet Take 20 mg by mouth 2 (two) times daily. 04/18/20  Yes [provider]  Multiple Vitamin (MULTI VITAMIN DAILY PO) Take 1 tablet by mouth.   Yes [provider]  rosuvastatin (CRESTOR) 10 MG tablet Take 10 mg by mouth daily.   Yes [provider]  Turmeric 500 MG CAPS Take 1,000 mg by mouth daily.   Yes [provider]  vitamin C (ASCORBIC ACID) 500 MG tablet Take 500 mg by mouth daily.   Yes [provider]  ibuprofen (ADVIL) 200 MG tablet Take 200 mg by mouth as needed.    [provider]    Current Outpatient Medications  Medication Sig Dispense Refill   acetaminophen (TYLENOL) 500 MG tablet Take 500 mg by mouth as needed.     AMBULATORY NON FORMULARY MEDICATION Place 1 spray into both nostrils daily in the afternoon. Medication Name: Sensa-Mist     estrogen, conjugated,-medroxyprogesterone (PREMPRO) 0.3-1.5 MG per tablet Take 1 tablet by mouth daily.     famotidine (PEPCID) 20 MG tablet Take 20 mg by mouth 2 (two) times daily.     Multiple Vitamin (MULTI VITAMIN DAILY PO) Take 1 tablet by mouth.     rosuvastatin (CRESTOR) 10 MG tablet Take 10 mg by mouth daily.     Turmeric 500 MG CAPS Take 1,000 mg by mouth daily.     vitamin C (ASCORBIC ACID) 500 MG tablet Take 500 mg by mouth daily.     ibuprofen (ADVIL) 200 MG tablet Take 200 mg by mouth as needed.     Current Facility-Administered Medications  Medication Dose  Route Frequency Provider Last Rate Last Admin   0.9 %  sodium chloride infusion  500 mL Intravenous Once Sherri Morris, Sherri Lines, MD        Allergies as of 10/05/2022   (No Known Allergies)    Family History  Problem Relation Age of Onset   Stroke Mother    Diabetes Mellitus II Mother    Hypertension Mother    Heart attack Father    Heart disease Father    Angina Father    Hypertension Father    Peripheral vascular disease Father    Hypertension Brother    Hypertension Brother    Hypertension Brother    Colon cancer Neg Hx     Social History   Socioeconomic History   Marital status: Married    Spouse name: Not on file   Number of children: 2   Years of education: Not on  file   Highest education level: Not on file  Occupational History   Not on file  Tobacco Use   Smoking status: Some Days    Types: Cigarettes   Smokeless tobacco: Never   Tobacco comments:    1 CIG A DAY  Vaping Use   Vaping Use: Never used  Substance and Sexual Activity   Alcohol use: Yes    Alcohol/week: 5.0 standard drinks of alcohol    Types: 5 Standard drinks or equivalent per week    Comment: WINE   Drug use: No   Sexual activity: Not on file  Other Topics Concern   Not on file  Social History Narrative   Not on file   Social Determinants of Health   Financial Resource Strain: Not on file  Food Insecurity: Not on file  Transportation Needs: Not on file  Physical Activity: Not on file  Stress: Not on file  Social Connections: Not on file  Intimate Partner Violence: Not on file    Physical Exam: Vital signs in last 24 hours: '@BP'$  (!) 102/53   Pulse 63   Temp 98 F (36.7 C)   Ht '5\' 6"'$  (1.676 m)   Wt 150 lb (68 kg)   SpO2 96%   BMI 24.21 kg/m  GEN: NAD EYE: Sclerae anicteric ENT: MMM CV: Non-tachycardic Pulm: CTA b/l GI: Soft, NT/ND NEURO:  Alert & Oriented x 3   Zenovia Jarred, MD [facility]  10/05/2022 10:22 AM

## 2022-10-05 NOTE — Progress Notes (Signed)
Called to room to assist during endoscopic procedure.  Patient ID and intended procedure confirmed with present staff. Received instructions for my participation in the procedure from the performing physician.  

## 2022-10-05 NOTE — Progress Notes (Signed)
1033 Robinul 0.1 mg IV given due large amount of secretions upon assessment.  MD made aware, vss

## 2022-10-06 ENCOUNTER — Telehealth: Payer: Self-pay | Admitting: *Deleted

## 2022-10-06 DIAGNOSIS — M25519 Pain in unspecified shoulder: Secondary | ICD-10-CM | POA: Diagnosis not present

## 2022-10-06 NOTE — Telephone Encounter (Signed)
  Follow up Call-     10/05/2022    9:44 AM  Call back number  Post procedure Call Back phone  # [phone]  Permission to leave phone message Yes     Patient questions:  Do you have a fever, pain , or abdominal swelling? No. Pain Score  0 *  Have you tolerated food without any problems? Yes.    Have you been able to return to your normal activities? Yes.    Do you have any questions about your discharge instructions: Diet   No. Medications  No. Follow up visit  No.  Do you have questions or concerns about your Care? No.  Actions: * If pain score is 4 or above: No action needed, pain <4.

## 2022-10-08 ENCOUNTER — Other Ambulatory Visit: Payer: Self-pay

## 2022-10-08 MED ORDER — PANTOPRAZOLE SODIUM 40 MG PO TBEC: 40.0000 mg | DELAYED_RELEASE_TABLET | Freq: Every day | ORAL | 3 refills | Status: DC

## 2022-10-13 DIAGNOSIS — M25519 Pain in unspecified shoulder: Secondary | ICD-10-CM | POA: Diagnosis not present

## 2022-10-20 DIAGNOSIS — M25562 Pain in left knee: Secondary | ICD-10-CM | POA: Diagnosis not present

## 2022-10-20 DIAGNOSIS — M25561 Pain in right knee: Secondary | ICD-10-CM | POA: Diagnosis not present

## 2022-10-27 DIAGNOSIS — M25519 Pain in unspecified shoulder: Secondary | ICD-10-CM | POA: Diagnosis not present

## 2022-11-03 DIAGNOSIS — L111 Transient acantholytic dermatosis [Grover]: Secondary | ICD-10-CM | POA: Diagnosis not present

## 2022-11-03 DIAGNOSIS — D2262 Melanocytic nevi of left upper limb, including shoulder: Secondary | ICD-10-CM | POA: Diagnosis not present

## 2022-11-03 DIAGNOSIS — L570 Actinic keratosis: Secondary | ICD-10-CM | POA: Diagnosis not present

## 2022-11-03 DIAGNOSIS — Z85820 Personal history of malignant melanoma of skin: Secondary | ICD-10-CM | POA: Diagnosis not present

## 2022-11-03 DIAGNOSIS — L578 Other skin changes due to chronic exposure to nonionizing radiation: Secondary | ICD-10-CM | POA: Diagnosis not present

## 2022-11-03 DIAGNOSIS — D485 Neoplasm of uncertain behavior of skin: Secondary | ICD-10-CM | POA: Diagnosis not present

## 2022-11-03 DIAGNOSIS — Z85828 Personal history of other malignant neoplasm of skin: Secondary | ICD-10-CM | POA: Diagnosis not present

## 2022-11-03 DIAGNOSIS — L239 Allergic contact dermatitis, unspecified cause: Secondary | ICD-10-CM | POA: Diagnosis not present

## 2022-11-04 DIAGNOSIS — M25519 Pain in unspecified shoulder: Secondary | ICD-10-CM | POA: Diagnosis not present

## 2022-11-16 DIAGNOSIS — M25519 Pain in unspecified shoulder: Secondary | ICD-10-CM | POA: Diagnosis not present

## 2022-11-24 DIAGNOSIS — D485 Neoplasm of uncertain behavior of skin: Secondary | ICD-10-CM | POA: Diagnosis not present

## 2022-11-24 DIAGNOSIS — Z85828 Personal history of other malignant neoplasm of skin: Secondary | ICD-10-CM | POA: Diagnosis not present

## 2022-11-24 DIAGNOSIS — Z85820 Personal history of malignant melanoma of skin: Secondary | ICD-10-CM | POA: Diagnosis not present

## 2022-11-24 DIAGNOSIS — D48118 Desmoid tumor of other site: Secondary | ICD-10-CM | POA: Diagnosis not present

## 2023-01-01 ENCOUNTER — Ambulatory Visit: Payer: Medicare PPO | Admitting: Internal Medicine

## 2023-01-01 ENCOUNTER — Encounter: Payer: Self-pay | Admitting: Internal Medicine

## 2023-01-01 VITALS — BP 126/70 | HR 75 | Ht 66.5 in | Wt 147.4 lb

## 2023-01-01 DIAGNOSIS — K224 Dyskinesia of esophagus: Secondary | ICD-10-CM

## 2023-01-01 DIAGNOSIS — K2100 Gastro-esophageal reflux disease with esophagitis, without bleeding: Secondary | ICD-10-CM | POA: Diagnosis not present

## 2023-01-01 DIAGNOSIS — R0789 Other chest pain: Secondary | ICD-10-CM

## 2023-01-01 MED ORDER — LANSOPRAZOLE 30 MG PO CPDR: 30.0000 mg | DELAYED_RELEASE_CAPSULE | Freq: Every day | ORAL | 11 refills | Status: DC

## 2023-01-01 NOTE — Progress Notes (Signed)
   Subjective:    Patient ID: Sherri Morris, female    DOB: 1946-04-15, 77 y.o.   MRN: [id number]  HPI Sherri Morris is a 77 year old female with a history of GERD with esophagitis, hyperlipidemia, history of melanoma, vitamin D deficiency who is seen for follow-up.  She is here alone today.  I saw her in the office on 08/05/2022 and for upper endoscopy on 10/05/2022.  EGD was performed to evaluate atypical chest pain and dysphagia. EGD revealed mild diffuse inflammation in the lower third of the esophagus which was biopsied.  There was noncircumferential stenosis at the GE junction.  A 4 mm bleeding angioectasias was found in the gastric body and treated with clip placement.  Gastric biopsies were performed. Pathology from the esophagus showed reactive squamous mucosa consistent with reflux.  Gastric biopsies showed reactive gastropathy with hyperplasia without H. pylori or metaplasia.  She has been on pantoprazole 40 mg daily.  She has had complete resolution of her dysphagia symptoms as well as her atypical chest pain.  No further chest tightness or spasm.  She did also see cardiology and had a negative stress echocardiogram which was reassuring.  She has noticed definitively looser stools with the pantoprazole.  She initially was taking this after her coffee but an hour before meals.  She moved this to after breakfast and mild symptoms are better it is still a problem for her.  Previously stools were once a day and formed now there is 2-4 times a day with more loose and urgency.  No blood in stool or melena.  Bowel habits are unpredictable with the pantoprazole.   Review of Systems As per HPI, otherwise negative  Current Medications, Allergies, Past Medical History, Past Surgical History, Family History and Social History were reviewed in [employer] record.    Objective:   Physical Exam BP 126/70   Pulse 75   Ht 5' 6.5" (1.689 m)   Wt 147 lb 6.4 oz (66.9 kg)    SpO2 97%   BMI 23.43 kg/m . Gen: awake, alert, NAD HEENT: anicteric  Neuro: nonfocal     Assessment & Plan:  77 year old female with a history of GERD with esophagitis, hyperlipidemia, history of melanoma, vitamin D deficiency who is seen for follow-up.    GERD with esophagitis/esophageal dysphagia/atypical chest pain probable spasm of the esophagus --all symptoms have resolved with PPI.  She seems to also have benefited from esophageal dilation in the distal esophagus.  The pantoprazole is causing loose stools and while this may be a class effect we will change medicines to see if loose stool symptoms abate.  Famotidine was not helpful previously for her atypical chest pain and reflux symptoms. -- Lansoprazole 30 mg daily -- Discontinue pantoprazole -- I asked the patient in 3 to 4 weeks to send me a MyChart message to let me know if loose stools have improved.  If not consider vonoprazan for recurrent GERD or atypical chest pain symptoms.  2.  CRC screening --screening colonoscopy recommended January 2025  30 minutes total spent today including patient facing time, coordination of care, reviewing medical history/procedures/pertinent radiology studies, and documentation of the encounter.

## 2023-01-01 NOTE — Patient Instructions (Addendum)
Discontinue pantoprazole.   We have sent the following medications to your pharmacy for you to pick up at your convenience: lansoprazole.  Please MyChart message our office in 3-4 weeks if your loose stools have not improved.   The [facility] providers would like to encourage you to use [facility] to communicate with providers for non-urgent requests or questions.  Due to long hold times on the telephone, sending your provider a message by [facility] may be a faster and more efficient way to get a response.  Please allow 48 business hours for a response.  Please remember that this is for non-urgent requests.

## 2023-01-04 DIAGNOSIS — M25519 Pain in unspecified shoulder: Secondary | ICD-10-CM | POA: Diagnosis not present

## 2023-02-01 DIAGNOSIS — M25519 Pain in unspecified shoulder: Secondary | ICD-10-CM | POA: Diagnosis not present

## 2023-02-02 DIAGNOSIS — L578 Other skin changes due to chronic exposure to nonionizing radiation: Secondary | ICD-10-CM | POA: Diagnosis not present

## 2023-02-02 DIAGNOSIS — D2239 Melanocytic nevi of other parts of face: Secondary | ICD-10-CM | POA: Diagnosis not present

## 2023-02-02 DIAGNOSIS — Z85828 Personal history of other malignant neoplasm of skin: Secondary | ICD-10-CM | POA: Diagnosis not present

## 2023-02-02 DIAGNOSIS — L821 Other seborrheic keratosis: Secondary | ICD-10-CM | POA: Diagnosis not present

## 2023-02-02 DIAGNOSIS — D692 Other nonthrombocytopenic purpura: Secondary | ICD-10-CM | POA: Diagnosis not present

## 2023-02-02 DIAGNOSIS — L570 Actinic keratosis: Secondary | ICD-10-CM | POA: Diagnosis not present

## 2023-02-02 DIAGNOSIS — D225 Melanocytic nevi of trunk: Secondary | ICD-10-CM | POA: Diagnosis not present

## 2023-02-02 DIAGNOSIS — Z85820 Personal history of malignant melanoma of skin: Secondary | ICD-10-CM | POA: Diagnosis not present

## 2023-02-02 DIAGNOSIS — L814 Other melanin hyperpigmentation: Secondary | ICD-10-CM | POA: Diagnosis not present

## 2023-02-16 IMAGING — CT CT CARDIAC CORONARY ARTERY CALCIUM SCORE
3 series · 14 of 20 positions shown, 16 images · non-contrast
Comparison: 11/28/2018

CLINICAL DATA: Hyperlipidemia, family history

EXAM:
CT CARDIAC CORONARY ARTERY CALCIUM SCORE
TECHNIQUE: Non-contrast imaging through the heart was performed using
prospective ECG gating. Image post processing was performed on an
independent workstation, allowing for quantitative analysis of the
heart and coronary arteries. Note that this exam targets the heart
and the chest was not imaged in its entirety.

[Series 2: calcium scoring 2.00 qr36 bestdiast 70% hrt calciu · axial · 0.35mm/px · z∈[+1543,+1627]mm · 4 of 71 slices shown]
[im 15/71  vessel]
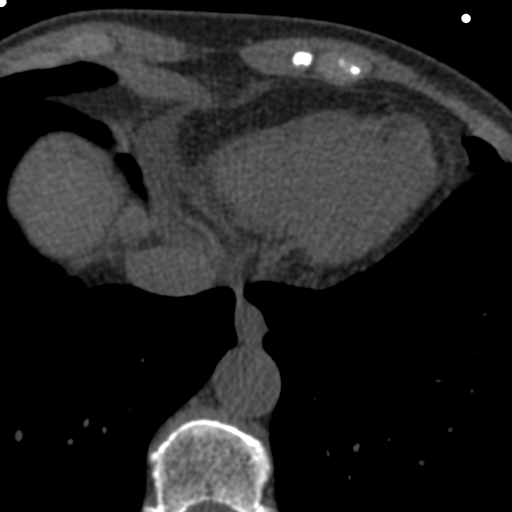
[im 29/71  vessel]
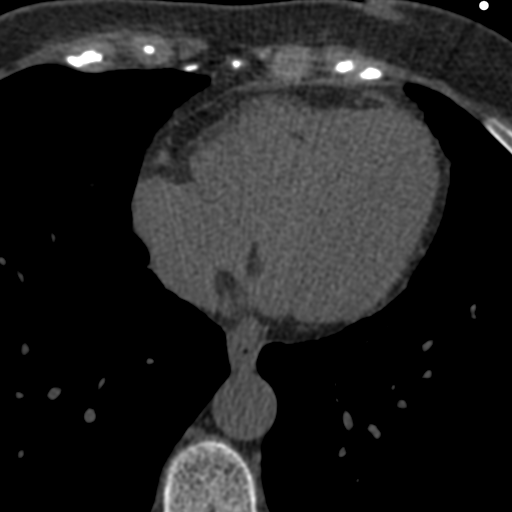
[im 43/71  vessel]
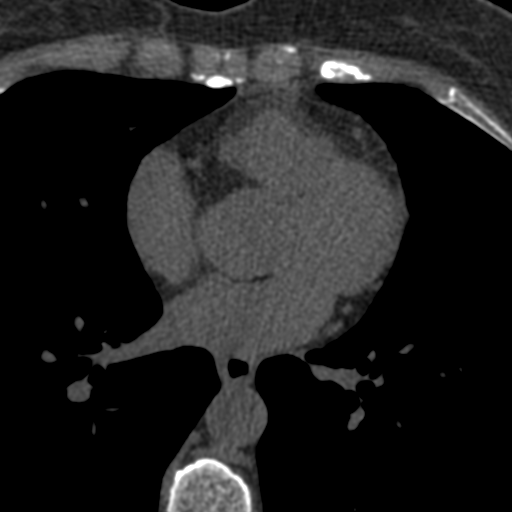
[im 57/71  vessel]
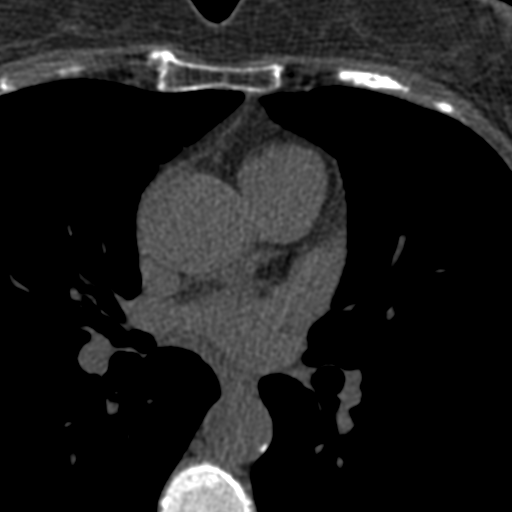

[Series 3: calcium scoring 2.00 br40 bestdiast 70% axial · axial · 0.53mm/px · z∈[+1537,+1631]mm · 5 of 71 slices shown, 7 images]
[im 12/71  vessel]
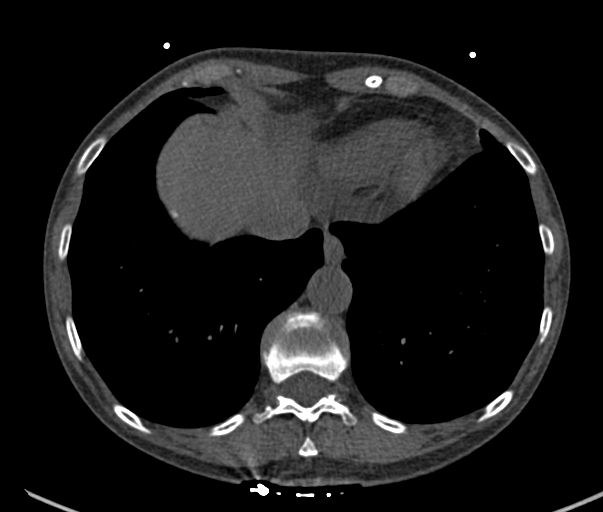
[im 12/71  lung]
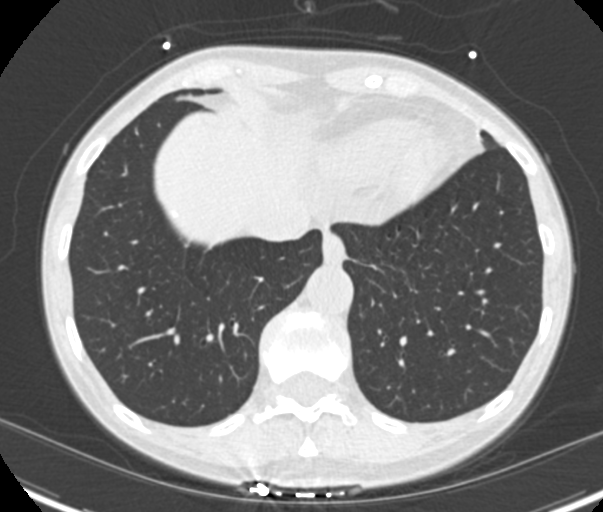
[im 24/71  vessel]
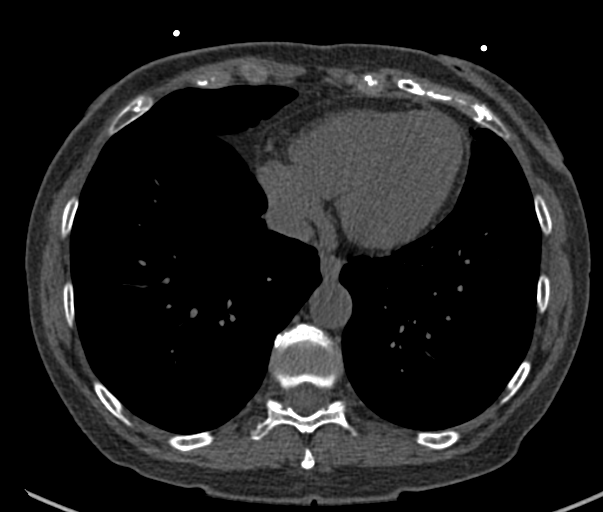
[im 36/71  vessel]
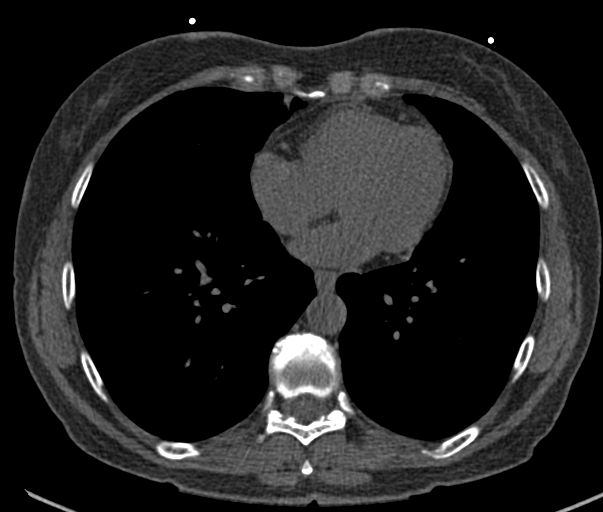
[im 47/71  vessel]
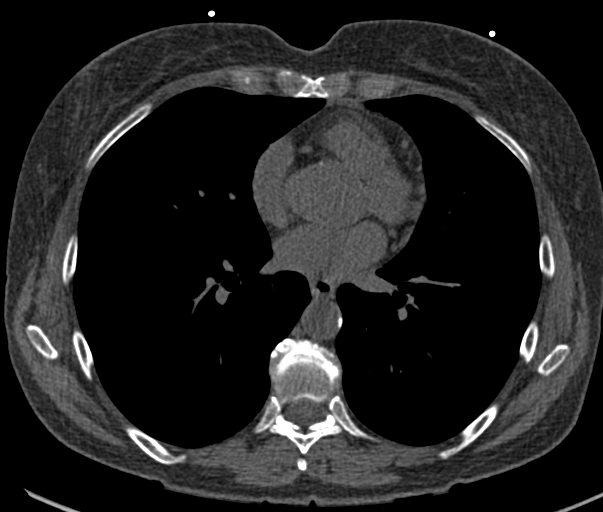
[im 59/71  vessel]
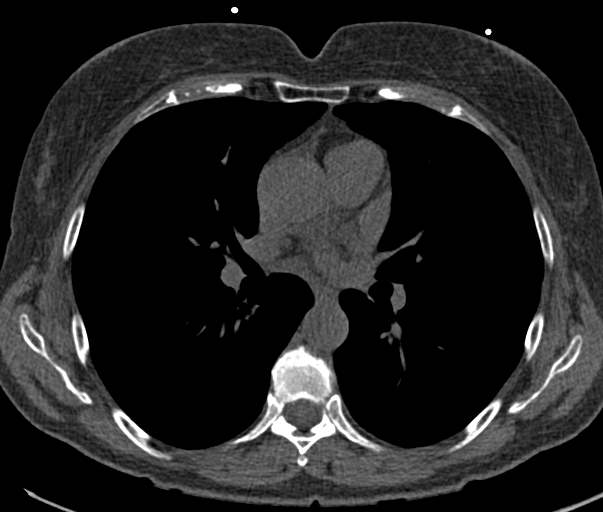
[im 59/71  lung]
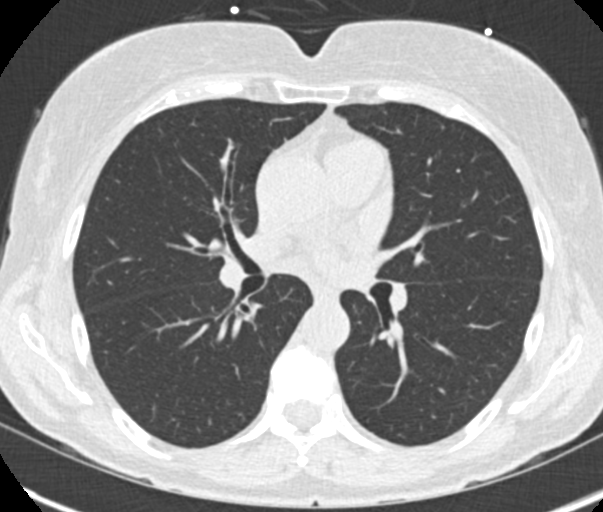

[Series 9: calcium scoring 2.00 br60 bestdiast 70% lungs · axial · 0.53mm/px · z∈[+1537,+1631]mm · 5 of 71 slices shown]
[im 12/71  vessel]
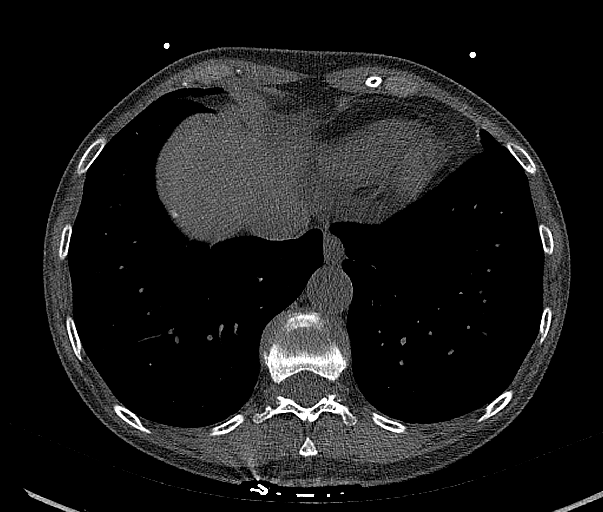
[im 24/71  vessel]
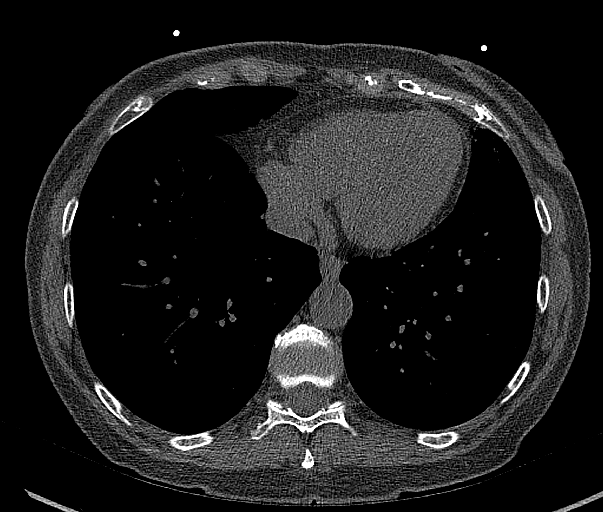
[im 36/71  vessel]
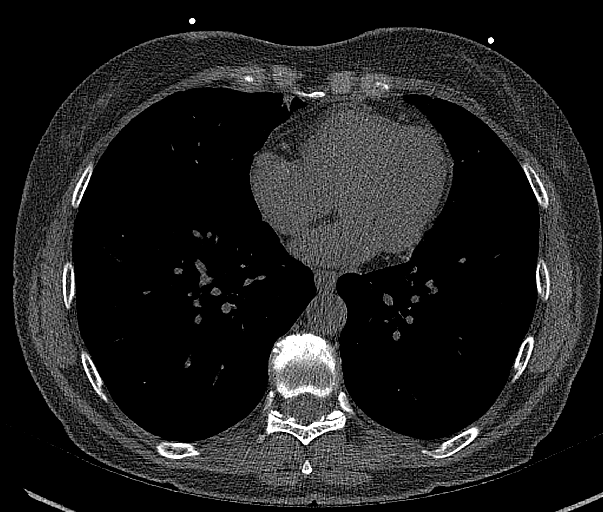
[im 47/71  vessel]
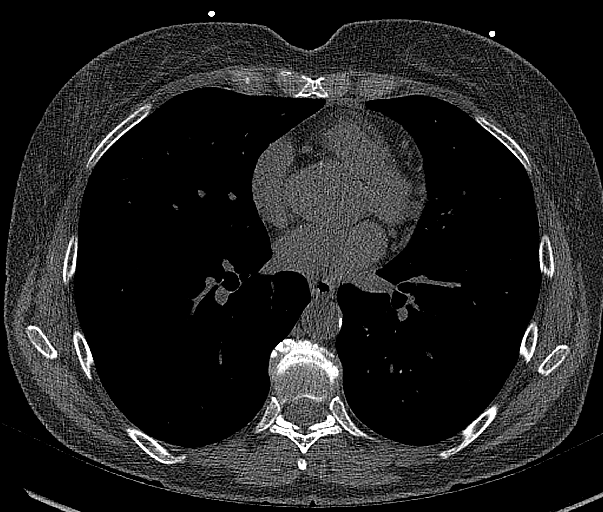
[im 59/71  vessel]
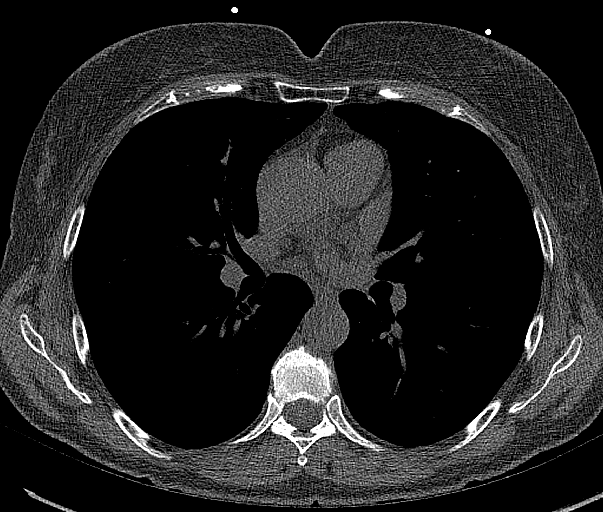

[14 of 20 positions shown; findings below may reference images not displayed]

FINDINGS: CORONARY CALCIUM SCORES:

Left Main:

LAD:

LCx: 0

RCA:

Total Agatston Score: 71

[HOSPITAL] percentile: 62

AORTA MEASUREMENTS:

Ascending Aorta: 33 mm

Descending Aorta: 22 mm

OTHER FINDINGS:

Heart is normal size. Aorta normal caliber. Scattered calcifications
in the aortic root and descending thoracic aorta. No adenopathy. No
confluent opacities or effusions. Imaging into the upper abdomen
demonstrates no acute findings. Chest wall soft tissues are
unremarkable. No acute bony abnormality.
IMPRESSION: Total Agatston score: 71

[HOSPITAL] percentile: 62

Scattered aortic atherosclerosis.

No acute extra cardiac abnormality.

## 2023-03-08 DIAGNOSIS — Z1231 Encounter for screening mammogram for malignant neoplasm of breast: Secondary | ICD-10-CM | POA: Diagnosis not present

## 2023-03-08 DIAGNOSIS — M25519 Pain in unspecified shoulder: Secondary | ICD-10-CM | POA: Diagnosis not present

## 2023-04-08 DIAGNOSIS — M25519 Pain in unspecified shoulder: Secondary | ICD-10-CM | POA: Diagnosis not present

## 2023-04-12 DIAGNOSIS — R7989 Other specified abnormal findings of blood chemistry: Secondary | ICD-10-CM | POA: Diagnosis not present

## 2023-04-12 DIAGNOSIS — M8580 Other specified disorders of bone density and structure, unspecified site: Secondary | ICD-10-CM | POA: Diagnosis not present

## 2023-04-12 DIAGNOSIS — Z1212 Encounter for screening for malignant neoplasm of rectum: Secondary | ICD-10-CM | POA: Diagnosis not present

## 2023-04-12 DIAGNOSIS — E785 Hyperlipidemia, unspecified: Secondary | ICD-10-CM | POA: Diagnosis not present

## 2023-04-19 ENCOUNTER — Encounter: Payer: Self-pay | Admitting: Internal Medicine

## 2023-04-19 DIAGNOSIS — Z0000 Encounter for general adult medical examination without abnormal findings: Secondary | ICD-10-CM | POA: Diagnosis not present

## 2023-04-19 DIAGNOSIS — K219 Gastro-esophageal reflux disease without esophagitis: Secondary | ICD-10-CM | POA: Diagnosis not present

## 2023-04-19 DIAGNOSIS — H6120 Impacted cerumen, unspecified ear: Secondary | ICD-10-CM | POA: Diagnosis not present

## 2023-04-19 DIAGNOSIS — I2510 Atherosclerotic heart disease of native coronary artery without angina pectoris: Secondary | ICD-10-CM | POA: Diagnosis not present

## 2023-04-19 DIAGNOSIS — M8580 Other specified disorders of bone density and structure, unspecified site: Secondary | ICD-10-CM | POA: Diagnosis not present

## 2023-04-19 DIAGNOSIS — C44601 Unspecified malignant neoplasm of skin of unspecified upper limb, including shoulder: Secondary | ICD-10-CM | POA: Diagnosis not present

## 2023-04-19 DIAGNOSIS — E785 Hyperlipidemia, unspecified: Secondary | ICD-10-CM | POA: Diagnosis not present

## 2023-04-19 DIAGNOSIS — J309 Allergic rhinitis, unspecified: Secondary | ICD-10-CM | POA: Diagnosis not present

## 2023-04-22 DIAGNOSIS — H6123 Impacted cerumen, bilateral: Secondary | ICD-10-CM | POA: Diagnosis not present

## 2023-04-22 DIAGNOSIS — H9193 Unspecified hearing loss, bilateral: Secondary | ICD-10-CM | POA: Diagnosis not present

## 2023-04-22 DIAGNOSIS — M25519 Pain in unspecified shoulder: Secondary | ICD-10-CM | POA: Diagnosis not present

## 2023-04-23 DIAGNOSIS — M25561 Pain in right knee: Secondary | ICD-10-CM | POA: Diagnosis not present

## 2023-05-03 DIAGNOSIS — R82998 Other abnormal findings in urine: Secondary | ICD-10-CM | POA: Diagnosis not present

## 2023-05-07 DIAGNOSIS — E349 Endocrine disorder, unspecified: Secondary | ICD-10-CM | POA: Diagnosis not present

## 2023-05-07 DIAGNOSIS — M8588 Other specified disorders of bone density and structure, other site: Secondary | ICD-10-CM | POA: Diagnosis not present

## 2023-05-08 DIAGNOSIS — Z23 Encounter for immunization: Secondary | ICD-10-CM | POA: Diagnosis not present

## 2023-05-11 DIAGNOSIS — M25519 Pain in unspecified shoulder: Secondary | ICD-10-CM | POA: Diagnosis not present

## 2023-06-07 DIAGNOSIS — M25519 Pain in unspecified shoulder: Secondary | ICD-10-CM | POA: Diagnosis not present

## 2023-07-08 ENCOUNTER — Telehealth: Payer: Self-pay | Admitting: Internal Medicine

## 2023-07-08 DIAGNOSIS — K2100 Gastro-esophageal reflux disease with esophagitis, without bleeding: Secondary | ICD-10-CM

## 2023-07-08 DIAGNOSIS — R195 Other fecal abnormalities: Secondary | ICD-10-CM

## 2023-07-08 NOTE — Telephone Encounter (Signed)
Inbound call from patient requesting a call from nurse to discuss side effects she is having from Prevacid. Requesting a call after lunch. Please advise.

## 2023-07-09 ENCOUNTER — Other Ambulatory Visit: Payer: Self-pay

## 2023-07-09 DIAGNOSIS — R195 Other fecal abnormalities: Secondary | ICD-10-CM

## 2023-07-09 MED ORDER — FAMOTIDINE 40 MG PO TABS: 40.0000 mg | ORAL_TABLET | Freq: Two times a day (BID) | ORAL | 3 refills | Status: DC

## 2023-07-09 MED ORDER — FAMOTIDINE 40 MG PO TABS: 40.0000 mg | ORAL_TABLET | Freq: Every day | ORAL | 3 refills | Status: DC

## 2023-07-09 NOTE — Telephone Encounter (Signed)
Pt states she was taking protonix in Jan/Feb and started having issues with having 4-5 BM's daily. Now she is taking prevacid 30mg  daily and in the last 2 weeks she has started having abdominal cramps and with about the 3rd cramp she has a BM. Last night she awakened and had to go to the bathroom. Reports she is having muddy/liquid/diarrhea. Reports she has done nothing different and has not been on any antibiotics. She is concerned she is not getting the nutrients she needs with the frequent BM's. Please advise.

## 2023-07-09 NOTE — Telephone Encounter (Signed)
Patient called and stated that she is experiencing side effects from the Prevacid and is requesting to speak to a nurse today before the weekend starts. Please advise.

## 2023-07-09 NOTE — Telephone Encounter (Signed)
Okay, loose stools could be PPI related It is unlikely that she is not having normal absorption Can check fecal elastase to ensure normal pancreatic function  She was previously on famotidine 20 mg twice daily when we saw mild inflammation from reflux and also she was having reflux related chest discomfort We can go back to famotidine but would do so at higher dose 40 mg twice daily  She can keep [place] updated as to if this change back to famotidine resolves her bowel changes and if her heartburn/GERD is well-controlled

## 2023-07-09 NOTE — Telephone Encounter (Signed)
Pt aware of Dr. Lauro Franklin recommendations. Script sent to pharmacy and lab order entered.

## 2023-07-12 DIAGNOSIS — M25519 Pain in unspecified shoulder: Secondary | ICD-10-CM | POA: Diagnosis not present

## 2023-07-16 ENCOUNTER — Ambulatory Visit (AMBULATORY_SURGERY_CENTER): Payer: Medicare PPO | Admitting: *Deleted

## 2023-07-16 VITALS — Ht 66.5 in | Wt 145.0 lb

## 2023-07-16 DIAGNOSIS — Z1211 Encounter for screening for malignant neoplasm of colon: Secondary | ICD-10-CM

## 2023-07-16 MED ORDER — NA SULFATE-K SULFATE-MG SULF 17.5-3.13-1.6 GM/177ML PO SOLN: 1.0000 | Freq: Once | ORAL | 0 refills | Status: AC

## 2023-07-16 NOTE — Progress Notes (Signed)
Pt's name and DOB verified at the beginning of the pre-visit wit 2 identifiers  Pt denies any difficulty with ambulating,sitting, laying down or rolling side to side  Pt has no issues with ambulation   Pt has no issues moving head neck or swallowing  No egg or soy allergy known to patient   No issues known to pt with past sedation with any surgeries or procedures  Pt denies having issues being intubated  No FH of Malignant Hyperthermia  Pt is not on diet pills or shots  Pt is not on home 02   Pt is not on blood thinners   Pt denies issues with constipation   Pt is not on dialysis  Pt denise any abnormal heart rhythms   Pt denies any upcoming cardiac testing  Pt encouraged to use to use Singlecare or Goodrx to reduce cost   Patient's chart reviewed by Sherri Morris CNRA prior to pre-visit and patient appropriate for the [facility].  Pre-visit completed and red dot placed by patient's name on their procedure day (on provider's schedule).  .  Visit by phone  Pt states weight is 145 lb  Instructed pt why it is important to and  to call if they have any changes in health or new medications. Directed them to the # given and on instructions.     Instructions reviewed. Pt given both [facility] main # and MD on call # prior to instructions.  Pt states understanding. Instructed to review again prior to procedure. Pt states they will.   Instructions sent by mail with coupon and by My Chart    Coupon sent via text to mobile phone and pt verified they received it

## 2023-08-02 DIAGNOSIS — I2510 Atherosclerotic heart disease of native coronary artery without angina pectoris: Secondary | ICD-10-CM | POA: Diagnosis not present

## 2023-08-02 DIAGNOSIS — Z1152 Encounter for screening for COVID-19: Secondary | ICD-10-CM | POA: Diagnosis not present

## 2023-08-02 DIAGNOSIS — R0981 Nasal congestion: Secondary | ICD-10-CM | POA: Diagnosis not present

## 2023-08-02 DIAGNOSIS — U071 COVID-19: Secondary | ICD-10-CM | POA: Diagnosis not present

## 2023-08-02 DIAGNOSIS — E785 Hyperlipidemia, unspecified: Secondary | ICD-10-CM | POA: Diagnosis not present

## 2023-08-02 DIAGNOSIS — R051 Acute cough: Secondary | ICD-10-CM | POA: Diagnosis not present

## 2023-08-02 DIAGNOSIS — R6883 Chills (without fever): Secondary | ICD-10-CM | POA: Diagnosis not present

## 2023-08-02 DIAGNOSIS — J392 Other diseases of pharynx: Secondary | ICD-10-CM | POA: Diagnosis not present

## 2023-08-02 DIAGNOSIS — J309 Allergic rhinitis, unspecified: Secondary | ICD-10-CM | POA: Diagnosis not present

## 2023-08-09 ENCOUNTER — Encounter: Payer: Self-pay | Admitting: Certified Registered Nurse Anesthetist

## 2023-08-10 ENCOUNTER — Encounter: Payer: Self-pay | Admitting: Internal Medicine

## 2023-08-10 DIAGNOSIS — H52203 Unspecified astigmatism, bilateral: Secondary | ICD-10-CM | POA: Diagnosis not present

## 2023-08-10 DIAGNOSIS — H18513 Endothelial corneal dystrophy, bilateral: Secondary | ICD-10-CM | POA: Diagnosis not present

## 2023-08-10 DIAGNOSIS — Z961 Presence of intraocular lens: Secondary | ICD-10-CM | POA: Diagnosis not present

## 2023-08-11 ENCOUNTER — Ambulatory Visit (AMBULATORY_SURGERY_CENTER): Payer: Medicare PPO | Admitting: Internal Medicine

## 2023-08-11 ENCOUNTER — Encounter: Payer: Self-pay | Admitting: Internal Medicine

## 2023-08-11 VITALS — BP 126/68 | HR 56 | Temp 97.3°F | Resp 10 | Ht 66.0 in | Wt 145.0 lb

## 2023-08-11 DIAGNOSIS — D120 Benign neoplasm of cecum: Secondary | ICD-10-CM

## 2023-08-11 DIAGNOSIS — D123 Benign neoplasm of transverse colon: Secondary | ICD-10-CM | POA: Diagnosis not present

## 2023-08-11 DIAGNOSIS — K648 Other hemorrhoids: Secondary | ICD-10-CM | POA: Diagnosis not present

## 2023-08-11 DIAGNOSIS — C180 Malignant neoplasm of cecum: Secondary | ICD-10-CM | POA: Diagnosis not present

## 2023-08-11 DIAGNOSIS — K635 Polyp of colon: Secondary | ICD-10-CM

## 2023-08-11 DIAGNOSIS — K5730 Diverticulosis of large intestine without perforation or abscess without bleeding: Secondary | ICD-10-CM | POA: Diagnosis not present

## 2023-08-11 DIAGNOSIS — Z1211 Encounter for screening for malignant neoplasm of colon: Secondary | ICD-10-CM | POA: Diagnosis not present

## 2023-08-11 DIAGNOSIS — D124 Benign neoplasm of descending colon: Secondary | ICD-10-CM | POA: Diagnosis not present

## 2023-08-11 DIAGNOSIS — E785 Hyperlipidemia, unspecified: Secondary | ICD-10-CM | POA: Diagnosis not present

## 2023-08-11 DIAGNOSIS — I2510 Atherosclerotic heart disease of native coronary artery without angina pectoris: Secondary | ICD-10-CM | POA: Diagnosis not present

## 2023-08-11 DIAGNOSIS — D125 Benign neoplasm of sigmoid colon: Secondary | ICD-10-CM

## 2023-08-11 MED ORDER — SODIUM CHLORIDE 0.9 % IV SOLN: 500.0000 mL | Freq: Once | INTRAVENOUS | Status: DC

## 2023-08-11 NOTE — Op Note (Signed)
 [facility] Patient Name: Sherri Morris Procedure Date: 08/11/2023 8:57 AM MRN: [id number] Endoscopist: Gordy CHRISTELLA Starch , MD, [id number] Age: 78 Referring MD:  Date of Birth: November 18, 1945 Gender: Female Account #: [id number] Procedure:                Colonoscopy Indications:              Screening for colorectal malignant neoplasm, Last                            colonoscopy 10 years ago (Dr. Obie) Medicines:                Monitored Anesthesia Care Procedure:                Pre-Anesthesia Assessment:                           - Prior to the procedure, a History and Physical                            was performed, and patient medications and                            allergies were reviewed. The patient's tolerance of                            previous anesthesia was also reviewed. The risks                            and benefits of the procedure and the sedation                            options and risks were discussed with the patient.                            All questions were answered, and informed consent                            was obtained. Prior Anticoagulants: The patient has                            taken no anticoagulant or antiplatelet agents. ASA                            Grade Assessment: II - A patient with mild systemic                            disease. After reviewing the risks and benefits,                            the patient was deemed in satisfactory condition to                            undergo the procedure.  After obtaining informed consent, the colonoscope                            was passed under direct vision. Throughout the                            procedure, the patient's blood pressure, pulse, and                            oxygen saturations were monitored continuously. The                            CF [id number] #[id number] was introduced through the anus                            and advanced to the  cecum, identified by                            appendiceal orifice and ileocecal valve. The                            colonoscopy was somewhat difficult due to a                            redundant colon. The patient tolerated the                            procedure well. The quality of the bowel                            preparation was good. The ileocecal valve,                            appendiceal orifice, and rectum were photographed. Scope In: 9:05:48 AM Scope Out: 9:42:09 AM Scope Withdrawal Time: 0 hours 27 minutes 5 seconds  Total Procedure Duration: 0 hours 36 minutes 21 seconds  Findings:                 The digital rectal exam was normal.                           A 10 mm polyp was found in the cecum. The polyp was                            sessile. Preparations were made for mucosal                            resection. Saline, 6 mL, was injected to raise the                            lesion. Cold snare mucosal resection was performed.                            Resection and retrieval were  complete. To close a                            defect after mucosal resection, two hemostatic                            clips were successfully placed (MR conditional).                            There was no bleeding at the end of the maneuver.                           A 6 mm polyp was found in the cecum. The polyp was                            sessile. The polyp was removed with a cold snare.                            Resection and retrieval were complete.                           A 5 mm polyp was found in the transverse colon. The                            polyp was flat. The polyp was removed with a cold                            snare. Resection and retrieval were complete.                           A 6 mm polyp was found in the descending colon. The                            polyp was sessile. The polyp was removed with a                            cold snare.  Resection and retrieval were complete.                           A 4 mm polyp was found in the sigmoid colon. The                            polyp was sessile. The polyp was removed with a                            cold snare. Resection and retrieval were complete.                           Multiple medium-mouthed and small-mouthed                            diverticula were found in the hepatic flexure and  ascending colon.                           Internal hemorrhoids were found during                            retroflexion. The hemorrhoids were small. Complications:            No immediate complications. Estimated Blood Loss:     Estimated blood loss was minimal. Impression:               - One 10 mm polyp in the cecum, removed with                            mucosal resection. Resected and retrieved. Clips                            (MR conditional) were placed.                           - One 6 mm polyp in the cecum, removed with a cold                            snare. Resected and retrieved.                           - One 5 mm polyp in the transverse colon, removed                            with a cold snare. Resected and retrieved.                           - One 6 mm polyp in the descending colon, removed                            with a cold snare. Resected and retrieved.                           - One 4 mm polyp in the sigmoid colon, removed with                            a cold snare. Resected and retrieved.                           - Mild diverticulosis at the hepatic flexure and in                            the ascending colon.                           - Small internal hemorrhoids.                           - Mucosal resection was performed. Resection and  retrieval were complete. Recommendation:           - Patient has a contact number available for                            emergencies. The signs and symptoms  of potential                            delayed complications were discussed with the                            patient. Return to normal activities tomorrow.                            Written discharge instructions were provided to the                            patient.                           - Resume previous diet.                           - Continue present medications.                           - No aspirin, ibuprofen, naproxen, or other                            non-steroidal anti-inflammatory drugs for 2 weeks                            after polyp removal.                           - Await pathology results.                           - Repeat colonoscopy is recommended for                            surveillance. The colonoscopy date will be                            determined after pathology results from today's                            exam become available for review. Gordy CHRISTELLA Starch, MD 08/11/2023 9:49:18 AM This report has been signed electronically.

## 2023-08-11 NOTE — Progress Notes (Signed)
 Pt's states no medical or surgical changes since previsit or office visit.

## 2023-08-11 NOTE — Patient Instructions (Signed)
 Educational handout provided to patient related to Hemorrhoids, Polyps, and Diverticulosis  Resume previous diet  Continue present medications- NO ASPIRIN, IBUPROFEN, NAPROXEN, OR OTHER NON-STEROIDAL ANTI-INFLAMMATORY DRUGS FOR 2 WEEKS AFTER POLYP REMOVAL.  Awaiting pathology results  YOU HAD AN ENDOSCOPIC PROCEDURE TODAY AT THE [facility]:   Refer to the procedure report that was given to you for any specific questions about what was found during the examination.  If the procedure report does not answer your questions, please call your gastroenterologist to clarify.  If you requested that your care partner not be given the details of your procedure findings, then the procedure report has been included in a sealed envelope for you to review at your convenience later.  YOU SHOULD EXPECT: Some feelings of bloating in the abdomen. Passage of more gas than usual.  Walking can help get rid of the air that was put into your GI tract during the procedure and reduce the bloating. If you had a lower endoscopy (such as a colonoscopy or flexible sigmoidoscopy) you may notice spotting of blood in your stool or on the toilet paper. If you underwent a bowel prep for your procedure, you may not have a normal bowel movement for a few days.  Please Note:  You might notice some irritation and congestion in your nose or some drainage.  This is from the oxygen used during your procedure.  There is no need for concern and it should clear up in a day or so.  SYMPTOMS TO REPORT IMMEDIATELY:  Following lower endoscopy (colonoscopy or flexible sigmoidoscopy):  Excessive amounts of blood in the stool  Significant tenderness or worsening of abdominal pains  Swelling of the abdomen that is new, acute  Fever of 100F or higher  For urgent or emergent issues, a gastroenterologist can be reached at any hour by calling [phone]. Do not use MyChart messaging for urgent concerns.    DIET:  We do  recommend a small meal at first, but then you may proceed to your regular diet.  Drink plenty of fluids but you should avoid alcoholic beverages for 24 hours.  ACTIVITY:  You should plan to take it easy for the rest of today and you should NOT DRIVE or use heavy machinery until tomorrow (because of the sedation medicines used during the test).    FOLLOW UP: Our staff will call the number listed on your records the next business day following your procedure.  We will call around 7:15- 8:00 am to check on you and address any questions or concerns that you may have regarding the information given to you following your procedure. If we do not reach you, we will leave a message.     If any biopsies were taken you will be contacted by phone or by letter within the next 1-3 weeks.  Please call us  at [phone] if you have not heard about the biopsies in 3 weeks.    SIGNATURES/CONFIDENTIALITY: You and/or your care partner have signed paperwork which will be entered into your electronic medical record.  These signatures attest to the fact that that the information above on your After Visit Summary has been reviewed and is understood.  Full responsibility of the confidentiality of this discharge information lies with you and/or your care-partner.

## 2023-08-11 NOTE — Progress Notes (Signed)
 Report given to PACU, vss

## 2023-08-11 NOTE — Progress Notes (Signed)
 GASTROENTEROLOGY PROCEDURE H&P NOTE   Primary Care Physician: Shayne Anes, MD    Reason for Procedure:   Colon cancer screening  Plan:    colonoscopy  Patient is appropriate for endoscopic procedure(s) in the ambulatory ([facility]) setting.  The nature of the procedure, as well as the risks, benefits, and alternatives were carefully and thoroughly reviewed with the patient. Ample time for discussion and questions allowed. The patient understood, was satisfied, and agreed to proceed.     HPI: Sherri Morris is a 78 y.o. female who presents for colonoscopy.  Medical history as below.  Tolerated the prep.  No recent chest pain or shortness of breath.  No abdominal pain today.  Past Medical History:  Diagnosis Date   Allergy    enviromental   Arthritis    CAD (coronary artery disease)    Cancer (HCC)    skin    Cataract    GERD (gastroesophageal reflux disease)    Hyperlipidemia    Major depressive disorder    Osteopenia    Shingles    Skin cancer    Skin melanoma (HCC)    Vitamin D insufficiency     Past Surgical History:  Procedure Laterality Date   CHOLECYSTECTOMY     CORNEAL TRANSPLANT Right 08/03/2010   CORNEAL TRANSPLANT Left 06/2020   FOOT FRACTURE SURGERY Left 08/04/2011   MELANOMA EXCISION  08/04/2011   ROTATOR CUFF REPAIR  12/2019   TONSILLECTOMY AND ADENOIDECTOMY      Prior to Admission medications   Medication Sig Start Date End Date Taking? Authorizing Provider  AMBULATORY NON FORMULARY MEDICATION Place 1 spray into both nostrils daily in the afternoon. Medication Name: Sensa-Mist   Yes [provider]  estrogen, conjugated,-medroxyprogesterone (PREMPRO ) 0.3-1.5 MG per tablet Take 1 tablet by mouth daily.   Yes [provider]  famotidine  (PEPCID ) 40 MG tablet Take 1 tablet (40 mg total) by mouth 2 (two) times daily. 07/09/23  Yes Gad Aymond, Gordy HERO, MD  Fexofenadine HCl (ALLEGRA PO) Take by mouth daily.   Yes [provider]   fluticasone (FLONASE SENSIMIST) 27.5 MCG/SPRAY nasal spray Use 1 spray in each nostril once daily in the morning 05/30/20  Yes [provider]  rosuvastatin  (CRESTOR ) 10 MG tablet Take 10 mg by mouth daily.   Yes [provider]  vitamin C (ASCORBIC ACID) 500 MG tablet Take 500 mg by mouth daily.   Yes [provider]  acetaminophen  (TYLENOL ) 500 MG tablet Take 500 mg by mouth as needed. Patient not taking: Reported on 07/16/2023    [provider]  famotidine  (PEPCID ) 40 MG tablet Take 1 tablet (40 mg total) by mouth daily. Patient not taking: Reported on 07/16/2023 07/09/23   Albertus Gordy HERO, MD  ibuprofen (ADVIL) 200 MG tablet Take 200 mg by mouth as needed. Patient not taking: Reported on 07/16/2023    [provider]  lansoprazole  (PREVACID ) 30 MG capsule Take 1 capsule (30 mg total) by mouth daily. Patient not taking: Reported on 08/11/2023 01/01/23   Albertus Gordy HERO, MD  Multiple Vitamin (MULTI VITAMIN DAILY PO) Take 1 tablet by mouth.    [provider]  Turmeric 500 MG CAPS Take 1,000 mg by mouth daily. Patient not taking: Reported on 08/11/2023    [provider]    Current Outpatient Medications  Medication Sig Dispense Refill   AMBULATORY NON FORMULARY MEDICATION Place 1 spray into both nostrils daily in the afternoon. Medication Name: Sensa-Mist  estrogen, conjugated,-medroxyprogesterone (PREMPRO ) 0.3-1.5 MG per tablet Take 1 tablet by mouth daily.     famotidine  (PEPCID ) 40 MG tablet Take 1 tablet (40 mg total) by mouth 2 (two) times daily. 60 tablet 3   Fexofenadine HCl (ALLEGRA PO) Take by mouth daily.     fluticasone (FLONASE SENSIMIST) 27.5 MCG/SPRAY nasal spray Use 1 spray in each nostril once daily in the morning     rosuvastatin  (CRESTOR ) 10 MG tablet Take 10 mg by mouth daily.     vitamin C (ASCORBIC ACID) 500 MG tablet Take 500 mg by mouth daily.     acetaminophen  (TYLENOL ) 500 MG tablet Take 500 mg by mouth as  needed. (Patient not taking: Reported on 07/16/2023)     famotidine  (PEPCID ) 40 MG tablet Take 1 tablet (40 mg total) by mouth daily. (Patient not taking: Reported on 07/16/2023) 60 tablet 3   ibuprofen (ADVIL) 200 MG tablet Take 200 mg by mouth as needed. (Patient not taking: Reported on 07/16/2023)     lansoprazole  (PREVACID ) 30 MG capsule Take 1 capsule (30 mg total) by mouth daily. (Patient not taking: Reported on 08/11/2023) 30 capsule 11   Multiple Vitamin (MULTI VITAMIN DAILY PO) Take 1 tablet by mouth.     Turmeric 500 MG CAPS Take 1,000 mg by mouth daily. (Patient not taking: Reported on 08/11/2023)     Current Facility-Administered Medications  Medication Dose Route Frequency Provider Last Rate Last Admin   0.9 %  sodium chloride  infusion  500 mL Intravenous Once Maleik Vanderzee, Gordy HERO, MD        Allergies as of 08/11/2023 - Review Complete 08/11/2023  Allergen Reaction Noted   Pantoprazole  Diarrhea 01/01/2023    Family History  Problem Relation Age of Onset   Stroke Mother    Diabetes Mellitus II Mother    Hypertension Mother    Heart attack Father    Heart disease Father    Angina Father    Hypertension Father    Peripheral vascular disease Father    Hypertension Brother    Hypertension Brother    Hypertension Brother    Colon cancer Neg Hx    Colon polyps Neg Hx    Esophageal cancer Neg Hx    Stomach cancer Neg Hx    Rectal cancer Neg Hx     Social History   Socioeconomic History   Marital status: Married    Spouse name: Not on file   Number of children: 2   Years of education: Not on file   Highest education level: Not on file  Occupational History   Not on file  Tobacco Use   Smoking status: Some Days    Types: Cigarettes   Smokeless tobacco: Never   Tobacco comments:    1 CIG A DAY  Vaping Use   Vaping status: Never Used  Substance and Sexual Activity   Alcohol  use: Yes    Alcohol /week: 5.0 standard drinks of alcohol     Types: 5 Standard drinks or  equivalent per week    Comment: WINE   Drug use: No   Sexual activity: Not on file  Other Topics Concern   Not on file  Social History Narrative   Not on file   Social Drivers of Health   Financial Resource Strain: Not on file  Food Insecurity: Not on file  Transportation Needs: Not on file  Physical Activity: Not on file  Stress: Not on file  Social Connections: Not on file  Intimate Partner Violence:  Not on file    Physical Exam: Vital signs in last 24 hours: @BP  114/62   Pulse 62   Temp (!) 97.3 F (36.3 C)   Ht 5' 6 (1.676 m)   Wt 145 lb (65.8 kg)   SpO2 98%   BMI 23.40 kg/m  GEN: NAD EYE: Sclerae anicteric ENT: MMM CV: Non-tachycardic Pulm: CTA b/l GI: Soft, NT/ND NEURO:  Alert & Oriented x 3   Gordy Starch, MD [facility]  08/11/2023 8:59 AM

## 2023-08-12 ENCOUNTER — Telehealth: Payer: Self-pay | Admitting: *Deleted

## 2023-08-12 NOTE — Telephone Encounter (Signed)
  Follow up Call-     08/11/2023    8:13 AM 10/05/2022    9:44 AM  Call back number  Post procedure Call Back phone  # [phone]  Permission to leave phone message Yes Yes     Patient questions:  Do you have a fever, pain , or abdominal swelling? No. Pain Score  0 *  Have you tolerated food without any problems? Yes.    Have you been able to return to your normal activities? Yes.    Do you have any questions about your discharge instructions: Diet   No. Medications  No. Follow up visit  No.  Do you have questions or concerns about your Care? No.  Actions: * If pain score is 4 or above: No action needed, pain <4.

## 2023-08-16 DIAGNOSIS — M25519 Pain in unspecified shoulder: Secondary | ICD-10-CM | POA: Diagnosis not present

## 2023-08-16 LAB — SURGICAL PATHOLOGY

## 2023-08-17 ENCOUNTER — Other Ambulatory Visit: Payer: Self-pay

## 2023-08-17 DIAGNOSIS — C180 Malignant neoplasm of cecum: Secondary | ICD-10-CM

## 2023-08-18 ENCOUNTER — Other Ambulatory Visit (INDEPENDENT_AMBULATORY_CARE_PROVIDER_SITE_OTHER): Payer: Medicare PPO

## 2023-08-18 DIAGNOSIS — C180 Malignant neoplasm of cecum: Secondary | ICD-10-CM | POA: Diagnosis not present

## 2023-08-18 LAB — COMPREHENSIVE METABOLIC PANEL
ALT: 14 U/L (ref 0–35)
AST: 22 U/L (ref 0–37)
Albumin: 4.3 g/dL (ref 3.5–5.2)
Alkaline Phosphatase: 62 U/L (ref 39–117)
BUN: 15 mg/dL (ref 6–23)
CO2: 29 meq/L (ref 19–32)
Calcium: 9.2 mg/dL (ref 8.4–10.5)
Chloride: 103 meq/L (ref 96–112)
Creatinine, Ser: 0.71 mg/dL (ref 0.40–1.20)
GFR: 83.39 mL/min (ref >60.00)
Glucose, Bld: 94 mg/dL (ref 70–99)
Potassium: 3.9 meq/L (ref 3.5–5.1)
Sodium: 141 meq/L (ref 135–145)
Total Bilirubin: 0.5 mg/dL (ref 0.2–1.2)
Total Protein: 6.8 g/dL (ref 6.0–8.3)

## 2023-08-18 LAB — CBC WITH DIFFERENTIAL/PLATELET
Basophils Absolute: 0 10*3/uL (ref 0.0–0.1)
Basophils Relative: 0.3 % (ref 0.0–3.0)
Eosinophils Absolute: 0.1 10*3/uL (ref 0.0–0.7)
Eosinophils Relative: 1.4 % (ref 0.0–5.0)
HCT: 39.2 % (ref 36.0–46.0)
Hemoglobin: 12.9 g/dL (ref 12.0–15.0)
Lymphocytes Relative: 11.9 % — ABNORMAL LOW (ref 12.0–46.0)
Lymphs Abs: 0.9 10*3/uL (ref 0.7–4.0)
MCHC: 32.8 g/dL (ref 30.0–36.0)
MCV: 94.2 fL (ref 78.0–100.0)
Monocytes Absolute: 0.6 10*3/uL (ref 0.1–1.0)
Monocytes Relative: 7.4 % (ref 3.0–12.0)
Neutro Abs: 6.1 10*3/uL (ref 1.4–7.7)
Neutrophils Relative %: 79 % — ABNORMAL HIGH (ref 43.0–77.0)
Platelets: 331 10*3/uL (ref 150.0–400.0)
RBC: 4.16 Mil/uL (ref 3.87–5.11)
RDW: 13.1 % (ref 11.5–15.5)
WBC: 7.8 10*3/uL (ref 4.0–10.5)

## 2023-08-19 LAB — CEA: CEA: <2 ng/mL

## 2023-08-20 ENCOUNTER — Encounter: Payer: Self-pay | Admitting: Internal Medicine

## 2023-08-20 ENCOUNTER — Ambulatory Visit (HOSPITAL_COMMUNITY)
Admission: RE | Admit: 2023-08-20 | Discharge: 2023-08-20 | Disposition: A | Discharge status: Home / Self Care | Payer: Medicare PPO | Source: Ambulatory Visit | Attending: Internal Medicine | Admitting: Internal Medicine

## 2023-08-20 DIAGNOSIS — I700 Atherosclerosis of aorta: Secondary | ICD-10-CM | POA: Diagnosis not present

## 2023-08-20 DIAGNOSIS — Z9049 Acquired absence of other specified parts of digestive tract: Secondary | ICD-10-CM | POA: Diagnosis not present

## 2023-08-20 DIAGNOSIS — C189 Malignant neoplasm of colon, unspecified: Secondary | ICD-10-CM | POA: Diagnosis not present

## 2023-08-20 DIAGNOSIS — C180 Malignant neoplasm of cecum: Secondary | ICD-10-CM | POA: Insufficient documentation

## 2023-08-20 MED ORDER — IOHEXOL 300 MG/ML  SOLN
30.0000 mL | Freq: Once | INTRAMUSCULAR | Status: AC | PRN
  Administered 2023-08-20: 30 mL via ORAL

## 2023-08-20 MED ORDER — IOHEXOL 300 MG/ML  SOLN
100.0000 mL | Freq: Once | INTRAMUSCULAR | Status: AC | PRN
  Administered 2023-08-20: 100 mL via INTRAVENOUS

## 2023-08-23 ENCOUNTER — Other Ambulatory Visit: Payer: Self-pay | Admitting: *Deleted

## 2023-08-23 DIAGNOSIS — Z85820 Personal history of malignant melanoma of skin: Secondary | ICD-10-CM | POA: Diagnosis not present

## 2023-08-23 DIAGNOSIS — R0789 Other chest pain: Secondary | ICD-10-CM

## 2023-08-23 DIAGNOSIS — L821 Other seborrheic keratosis: Secondary | ICD-10-CM | POA: Diagnosis not present

## 2023-08-23 DIAGNOSIS — Z85828 Personal history of other malignant neoplasm of skin: Secondary | ICD-10-CM | POA: Diagnosis not present

## 2023-08-23 DIAGNOSIS — C44729 Squamous cell carcinoma of skin of left lower limb, including hip: Secondary | ICD-10-CM | POA: Diagnosis not present

## 2023-08-23 DIAGNOSIS — L111 Transient acantholytic dermatosis [Grover]: Secondary | ICD-10-CM | POA: Diagnosis not present

## 2023-08-23 DIAGNOSIS — L814 Other melanin hyperpigmentation: Secondary | ICD-10-CM | POA: Diagnosis not present

## 2023-08-23 DIAGNOSIS — K224 Dyskinesia of esophagus: Secondary | ICD-10-CM

## 2023-08-23 DIAGNOSIS — R1319 Other dysphagia: Secondary | ICD-10-CM

## 2023-08-23 DIAGNOSIS — C180 Malignant neoplasm of cecum: Secondary | ICD-10-CM | POA: Insufficient documentation

## 2023-08-23 DIAGNOSIS — K2100 Gastro-esophageal reflux disease with esophagitis, without bleeding: Secondary | ICD-10-CM

## 2023-08-23 DIAGNOSIS — L570 Actinic keratosis: Secondary | ICD-10-CM | POA: Diagnosis not present

## 2023-08-23 NOTE — Progress Notes (Unsigned)
[facility]  [phone]   HEMATOLOGY ONCOLOGY CONSULTATION   KARMINA MATELSKI  DOB: [birth year]  MR#: [id number]  CSN#: [id number]     Patient Care Team: Rodrigo Ran, MD as PCP - General (Internal Medicine) Pyrtle, Carie Caddy, MD as Consulting Physician (Gastroenterology) Malachy Mood, MD as Consulting Physician (Hematology and Oncology)  Reason for consult: adenocarcinoma of cecum  History of present illness:   Patient had screening colonoscopy on 08/11/2023.  She was found to have a 10 mm polyp in the cecum for which mucosal resection was needed.  There is also a 6 mm polyp in the cecum, 5 mm polyp in the transverse colon, 6 mm polyp from the descending colon, and 4 mm polyp from the sigmoid colon.  Pathology from the larger polyp is positive for adenocarcinoma.  She has since had a CT scan of the chest abdomen pelvis with contrast.  There is focal wall thickening of the cecal base which is consistent with known colonic malignancy.  There were 2 tiny, low attenuating lesions in the liver (0.4 cm) and are too small to characterize at this time.  There is no evidence of lymphadenopathy or metastatic disease in the chest, abdomen, or pelvis at this time.  CEA tumor marker was negative at <2.0.  The patient is married. She has two adult children and two adult step-children. She has 6 grandchildren. She does now have family history of colon cancer. She states that she had one brother who passed away from bladder cancer. This brother was a heavy smoker and frequently drank alcohol. She is unaware of anyone else in her family who was treated for cancer of any sort.  The patient states that is an occasional smoker. States that she has one cigarette on most evenings. She will often go weeks without smoking at all. She does not want her grandchildren to know that she smokes, so she does not smoke when she is visiting with them. She is a moderate social drinker. She does not and has not  used illegal or illicit drugs.  Today, the patient present to the clinic with her husband. They are very pleasant and well-informed about the new diagnosis. She denies chest pain, chest pressure, or shortness of breath. She denies headaches or visual disturbances. She denies abdominal pain, nausea, vomiting, or changes in bowel or bladder habits.  She does state that she has had intermittent problems with constipation over her lifetime. Certain foods and medications she takes to treat acid reflux will make this worse. She has never noted blood in her stool or had hematemesis.   MEDICAL HISTORY:  Past Medical History:  Diagnosis Date   Allergy    enviromental   Arthritis    CAD (coronary artery disease)    Cancer (HCC)    skin    Cataract    GERD (gastroesophageal reflux disease)    Hyperlipidemia    Major depressive disorder    Osteopenia    Shingles    Skin cancer    Skin melanoma (HCC)    Vitamin D insufficiency     SURGICAL HISTORY: Past Surgical History:  Procedure Laterality Date   CHOLECYSTECTOMY     CORNEAL TRANSPLANT Right 08/03/2010   CORNEAL TRANSPLANT Left 06/2020   FOOT FRACTURE SURGERY Left 08/04/2011   MELANOMA EXCISION  08/04/2011   ROTATOR CUFF REPAIR  12/2019   TONSILLECTOMY AND ADENOIDECTOMY      SOCIAL HISTORY: Social History   Socioeconomic History  Marital status: Married    Spouse name: Not on file   Number of children: 2   Years of education: Not on file   Highest education level: Not on file  Occupational History   Not on file  Tobacco Use   Smoking status: Some Days    Types: Cigarettes   Smokeless tobacco: Never   Tobacco comments:    1 CIG A DAY  Vaping Use   Vaping status: Never Used  Substance and Sexual Activity   Alcohol use: Yes    Alcohol/week: 5.0 standard drinks of alcohol    Types: 5 Standard drinks or equivalent per week    Comment: WINE   Drug use: No   Sexual activity: Not on file  Other Topics Concern   Not on file   Social History Narrative   Not on file   Social Drivers of Health   Financial Resource Strain: Not on file  Food Insecurity: No Food Insecurity (08/24/2023)   Hunger Vital Sign    Worried About Running Out of Food in the Last Year: Never true    Ran Out of Food in the Last Year: Never true  Transportation Needs: No Transportation Needs (08/24/2023)   PRAPARE - Administrator, Civil Service (Medical): No    Lack of Transportation (Non-Medical): No  Physical Activity: Not on file  Stress: Not on file  Social Connections: Not on file  Intimate Partner Violence: Not At Risk (08/24/2023)   Humiliation, Afraid, Rape, and Kick questionnaire    Fear of Current or Ex-Partner: No    Emotionally Abused: No    Physically Abused: No    Sexually Abused: No    FAMILY HISTORY: Family History  Problem Relation Age of Onset   Stroke Mother    Diabetes Mellitus II Mother    Hypertension Mother    Heart attack Father    Heart disease Father    Angina Father    Hypertension Father    Peripheral vascular disease Father    Hypertension Brother    Hypertension Brother    Hypertension Brother    Colon cancer Neg Hx    Colon polyps Neg Hx    Esophageal cancer Neg Hx    Stomach cancer Neg Hx    Rectal cancer Neg Hx     ALLERGIES:  is allergic to pantoprazole.  MEDICATIONS:  Current Outpatient Medications  Medication Sig Dispense Refill   acetaminophen (TYLENOL) 500 MG tablet Take 500 mg by mouth as needed.     estrogen, conjugated,-medroxyprogesterone (PREMPRO) 0.3-1.5 MG per tablet Take 1 tablet by mouth daily.     famotidine (PEPCID) 40 MG tablet Take 1 tablet (40 mg total) by mouth 2 (two) times daily. 60 tablet 3   Fexofenadine HCl (ALLEGRA PO) Take by mouth daily.     fluticasone (FLONASE SENSIMIST) 27.5 MCG/SPRAY nasal spray Use 1 spray in each nostril once daily in the morning     Multiple Vitamin (MULTI VITAMIN DAILY PO) Take 1 tablet by mouth.     rosuvastatin  (CRESTOR) 10 MG tablet Take 10 mg by mouth daily.     vitamin C (ASCORBIC ACID) 500 MG tablet Take 500 mg by mouth daily.     No current facility-administered medications for this visit.    REVIEW OF SYSTEMS:   Constitutional: Denies fevers, chills or abnormal night sweats Eyes: Denies blurriness of vision, double vision or watery eyes Ears, nose, mouth, throat, and face: Denies mucositis or sore throat  Respiratory: Denies cough, dyspnea or wheezes Cardiovascular: Denies palpitation, chest discomfort or lower extremity swelling Gastrointestinal:  Denies nausea, heartburn or change in bowel habits Skin: Denies abnormal skin rashes Lymphatics: Denies new lymphadenopathy or easy bruising Neurological:Denies numbness, tingling or new weaknesses Behavioral/Psych: Mood is stable, no new changes  All other systems were reviewed with the patient and are negative.  PHYSICAL EXAMINATION: ECOG PERFORMANCE STATUS: 0 - Asymptomatic  Vitals:   08/24/23 0905  BP: 130/75  Pulse: 73  Resp: 15  Temp: (!) 97.3 F (36.3 C)  SpO2: 98%   Filed Weights   08/24/23 0905  Weight: 152 lb 11.2 oz (69.3 kg)    GENERAL:alert, no distress and comfortable SKIN: skin color, texture, turgor are normal, no rashes or significant lesions EYES: normal, conjunctiva are pink and non-injected, sclera clear OROPHARYNX:no exudate, no erythema and lips, buccal mucosa, and tongue normal  NECK: supple, thyroid normal size, non-tender, without nodularity LYMPH:  no palpable lymphadenopathy in the cervical, axillary or inguinal LUNGS: clear to auscultation and percussion with normal breathing effort HEART: regular rate & rhythm and no murmurs and no lower extremity edema ABDOMEN:abdomen soft, non-tender and normal bowel sounds Musculoskeletal:no cyanosis of digits and no clubbing  PSYCH: alert & oriented x 3 with fluent speech NEURO: no focal motor/sensory deficits  LABORATORY DATA:  I have reviewed the data as  listed Lab Results  Component Value Date   WBC 7.8 08/18/2023   HGB 12.9 08/18/2023   HCT 39.2 08/18/2023   MCV 94.2 08/18/2023   PLT 331.0 08/18/2023   Recent Labs    08/18/23 1049  NA 141  K 3.9  CL 103  CO2 29  GLUCOSE 94  BUN 15  CREATININE 0.71  CALCIUM 9.2  PROT 6.8  ALBUMIN 4.3  AST 22  ALT 14  ALKPHOS 62  BILITOT 0.5    RADIOGRAPHIC STUDIES: CT CHEST ABDOMEN PELVIS W CONTRAST Result Date: 08/22/2023 CLINICAL DATA:  Colon cancer, cecal adenocarcinoma staging * Tracking Code: BO * EXAM: CT CHEST, ABDOMEN, AND PELVIS WITH CONTRAST TECHNIQUE: Multidetector CT imaging of the chest, abdomen and pelvis was performed following the standard protocol during bolus administration of intravenous contrast. RADIATION DOSE REDUCTION: This exam was performed according to the departmental dose-optimization program which includes automated exposure control, adjustment of the mA and/or kV according to patient size and/or use of iterative reconstruction technique. CONTRAST:  30mL OMNIPAQUE IOHEXOL 300 MG/ML SOLN, OMNIPAQUE IOHEXOL 300 MG/ML SOLN additional oral enteric contrast COMPARISON:  CT chest angiogram, 11/28/2018 FINDINGS: CT CHEST FINDINGS Cardiovascular: Scattered aortic atherosclerosis. Normal heart size. Left coronary artery calcifications. No pericardial effusion. Mediastinum/Nodes: No enlarged mediastinal, hilar, or axillary lymph nodes. Thyroid gland, trachea, and esophagus demonstrate no significant findings. Lungs/Pleura: Lungs are clear. No pleural effusion or pneumothorax. Musculoskeletal: No chest wall abnormality. No acute osseous findings. CT ABDOMEN PELVIS FINDINGS Hepatobiliary: Tiny low-attenuation lesion in the anterior left lobe of the liver hepatic segment II/III measuring 0.4 cm, not seen on prior examination dated 2020 (series 2, image 59). Additional tiny lesion adjacent to the gallbladder fossa, hepatic segment IVB, not included in the field of view of prior  examination (series 2, image 64). Unchanged tiny lesion adjacent to the falciform ligament, hepatic segment III consistent with a tiny cyst or hemangioma (series 2, image 64). Status post cholecystectomy. Postoperative biliary dilatation. Pancreas: Unremarkable. No pancreatic ductal dilatation or surrounding inflammatory changes. Spleen: Normal in size without significant abnormality. Adrenals/Urinary Tract: Adrenal glands are unremarkable. Kidneys are normal,  without renal calculi, solid lesion, or hydronephrosis. Bladder is unremarkable. Stomach/Bowel: Stomach is within normal limits. Focal wall thickening of the cecal base with biopsy marking clip (series 2, image 73, series 4, image 42). No bowel distention or inflammation. Large burden of stool throughout the colon. Vascular/Lymphatic: No significant vascular findings are present. No enlarged abdominal or pelvic lymph nodes. Reproductive: No mass or other abnormality. Other: No abdominal wall hernia or abnormality. No ascites. Musculoskeletal: No acute osseous findings. IMPRESSION: 1. Focal wall thickening of the cecal base with biopsy marking clip, consistent with known primary colonic malignancy. 2. Two tiny low-attenuation lesions in the liver, measuring 0.4 cm, not seen on prior examination dated 2020. These are too small to definitively characterize by CT, although statistically most likely tiny cysts or hemangiomas especially in the absence of other evidence of metastatic disease, although must be treated with modest suspicion. MRI could be helpful to confidently characterize these as fluid signal cysts otherwise attention on follow-up. 3. No evidence of lymphadenopathy or findings suspicious for metastatic disease in the chest, abdomen, or pelvis. 4. Coronary artery disease. Aortic Atherosclerosis (ICD10-I70.0). Electronically Signed   By: Jearld Lesch M.D.   On: 08/22/2023 07:31   Adenocarcinoma of cecum [facility]) Assessment & Plan: Found on screening  colonoscopy done 08/11/2023.  She was found to have a 10 mm polyp in the cecum for which mucosal resection was needed.  There is also a 6 mm polyp in the cecum, 5 mm polyp in the transverse colon, 6 mm polyp from the descending colon, and 4 mm polyp from the sigmoid colon.  Pathology from the larger polyp is positive for adenocarcinoma, arising from tubular adenoma. She has since had a CT scan of the chest abdomen pelvis with contrast.  There is focal wall thickening of the cecal base which is consistent with known colonic malignancy.  There were 2 tiny, low attenuating lesions in the liver (0.4 cm) and are too small to characterize at this time.  There is no evidence of lymphadenopathy or metastatic disease in the chest, abdomen, or pelvis at this time.  CEA tumor marker was negative at <2.0 on 08/18/2023.  -she is currently scheduled to see Dr. Maisie Fus, colorectal surgeon, on 09/14/2023. Will reach out to see if consultation can be moved to sooner date.  -will get MRI of liver to further characterize small liver lesions seen on CT CAP. -plan to follow up with patient after MRI and likely surgery.    Orders: -     MR LIVER W WO CONTRAST; Future    All questions were answered. The patient knows to call the clinic with any problems, questions or concerns. The patient was seen along with Dr. Mosetta Putt.       Carlean Jews, NP 08/24/2023 10:45 AM  Addendum I have seen the patient, examined her. I agree with the assessment and and plan and have edited the notes.   78 year old female without significant past medical history, very fit and healthy, dented with screening discovered 1 cm polyp in cecum status post polypectomy, pathology showed invasive moderately differentiated adenocarcinoma arising from tubular adenoma with high-grade dysplasia.  The other 3 polyps were all tubular adenoma without high-grade dysplasia or malignancy.  She is asymptomatic.  Staging CT scan showed a few small liver lesions, in  determinate.  I recommended liver MRI with and without contrast for further evaluation.  My suspicion of liver metastasis is low.  Given the unknown margin from polypectomy, she would likely  need right hemicolectomy.  She is scheduled to see Dr. Maisie Fus.  I discussed the role of adjuvant chemotherapy in stage III and selected stage II colon cancer, she probably has very early stage disease and does not need adjuvant chemo.  We will review her surgical pathology.  All questions were answered.  Will see her as needed after surgery.  I spent a total of 45 minutes for her visit today, more than 50% time on face-to-face counseling.  Malachy Mood MD 08/24/2023

## 2023-08-24 ENCOUNTER — Inpatient Hospital Stay: Payer: Medicare PPO

## 2023-08-24 ENCOUNTER — Inpatient Hospital Stay: Payer: Medicare PPO | Attending: Nurse Practitioner | Admitting: Nurse Practitioner

## 2023-08-24 VITALS — BP 130/75 | HR 73 | Temp 97.3°F | Resp 15 | Ht 66.0 in | Wt 152.7 lb

## 2023-08-24 DIAGNOSIS — F17210 Nicotine dependence, cigarettes, uncomplicated: Secondary | ICD-10-CM | POA: Diagnosis not present

## 2023-08-24 DIAGNOSIS — C180 Malignant neoplasm of cecum: Secondary | ICD-10-CM | POA: Diagnosis not present

## 2023-08-24 DIAGNOSIS — Z8052 Family history of malignant neoplasm of bladder: Secondary | ICD-10-CM | POA: Insufficient documentation

## 2023-08-24 DIAGNOSIS — K769 Liver disease, unspecified: Secondary | ICD-10-CM | POA: Diagnosis not present

## 2023-08-24 DIAGNOSIS — D125 Benign neoplasm of sigmoid colon: Secondary | ICD-10-CM | POA: Insufficient documentation

## 2023-08-24 DIAGNOSIS — Z800 Family history of malignant neoplasm of digestive organs: Secondary | ICD-10-CM | POA: Diagnosis not present

## 2023-08-24 NOTE — Assessment & Plan Note (Signed)
Found on screening colonoscopy done 08/11/2023.  She was found to have a 10 mm polyp in the cecum for which mucosal resection was needed.  There is also a 6 mm polyp in the cecum, 5 mm polyp in the transverse colon, 6 mm polyp from the descending colon, and 4 mm polyp from the sigmoid colon.  Pathology from the larger polyp is positive for adenocarcinoma, arising from tubular adenoma. She has since had a CT scan of the chest abdomen pelvis with contrast.  There is focal wall thickening of the cecal base which is consistent with known colonic malignancy.  There were 2 tiny, low attenuating lesions in the liver (0.4 cm) and are too small to characterize at this time.  There is no evidence of lymphadenopathy or metastatic disease in the chest, abdomen, or pelvis at this time.  CEA tumor marker was negative at <2.0 on 08/18/2023.  -she is currently scheduled to see Dr. Maisie Fus, colorectal surgeon, on 09/14/2023. Will reach out to see if consultation can be moved to sooner date.  -will get MRI of liver to further characterize small liver lesions seen on CT CAP. -plan to follow up with patient after MRI and likely surgery.

## 2023-08-24 NOTE — Telephone Encounter (Signed)
I spoke to patient by phone today, see result note on CT scan

## 2023-08-25 ENCOUNTER — Other Ambulatory Visit: Payer: Self-pay | Admitting: *Deleted

## 2023-08-25 ENCOUNTER — Telehealth: Payer: Self-pay | Admitting: Nurse Practitioner

## 2023-08-25 NOTE — Progress Notes (Signed)
The proposed treatment discussed in conference is for discussion purpose only and is not a binding recommendation.  The patients have not been physically examined, or presented with their treatment options.  Therefore, final treatment plans cannot be decided.  

## 2023-08-25 NOTE — Telephone Encounter (Signed)
Spoke with patient over phone. Reviewed tumor board discussion with her. Tiny liver lesions present on prior studies. Recommendation is for close monitoring over MRI of liver at this time. The patent is agreeable to cancel MRI of the liver. We plan to follow up with the patient after surgery/surgical consultation.  -Vincent Gros, NP

## 2023-08-30 ENCOUNTER — Ambulatory Visit (HOSPITAL_COMMUNITY): Payer: Medicare PPO

## 2023-08-31 ENCOUNTER — Ambulatory Visit: Payer: Self-pay | Admitting: General Surgery

## 2023-08-31 DIAGNOSIS — C182 Malignant neoplasm of ascending colon: Secondary | ICD-10-CM | POA: Diagnosis not present

## 2023-08-31 NOTE — H&P (View-Only) (Signed)
REFERRING PHYSICIAN:  Pyrtle, Burnard Leigh, MD  PROVIDER:  Elenora Gamma, MD  MRN: [id number] DOB: 1946-01-04 DATE OF ENCOUNTER: 08/31/2023  Subjective   Chief Complaint: NEW CANCER     History of Present Illness: Sherri Morris is a 78 y.o. female who is seen today as an office consultation at the request of Dr. Rhea Belton for evaluation of NEW CANCER .  78 year old female who underwent routine surveillance colonoscopy in early January 2025.  Multiple polyps were removed from the cecum transverse colon descending colon and sigmoid colon.  The cecal polyp showed invasive moderately differentiated adenocarcinoma.  CT chest abdomen and pelvis shows some thickening at the cecal base with clips marking the known malignancy site.  There are 2 small lesions in the liver that are too small to characterize.  No lymphadenopathy or metastatic disease noted.  CEA is normal.  Surgical history significant for laparoscopic cholecystectomy approximately 20 years ago.  She averages approximately 0 to 1 cigarettes/day.   Review of Systems: A complete review of systems was obtained from the patient.  I have reviewed this information and discussed as appropriate with the patient.  See HPI as well for other ROS.    Medical History: Past Medical History:  Diagnosis Date   Arthritis    GERD (gastroesophageal reflux disease)    History of cancer    Hyperlipidemia     There is no problem list on file for this patient.   Past Surgical History:  Procedure Laterality Date   ARTHROSCOPIC ROTATOR CUFF REPAIR     CHOLECYSTECTOMY     Cornea Transplants     Foot surgery       Allergies  Allergen Reactions   Pantoprazole Diarrhea    Current Outpatient Medications on File Prior to Visit  Medication Sig Dispense Refill   ascorbic acid, vitamin C, (VITAMIN C) 500 MG tablet Take 500 mg by mouth once daily     famotidine (PEPCID) 20 MG tablet Take 30 mg by mouth 2 (two) times daily     multivitamin  tablet Take 1 tablet by mouth once daily     PREMPRO 0.3-1.5 mg tablet TAKE 1 TABLET BY MOUTH EVERY DAY Orally Once a day for 90 days     rosuvastatin (CRESTOR) 10 MG tablet Take 10 mg by mouth once daily     No current facility-administered medications on file prior to visit.    Family History  Problem Relation Age of Onset   Skin cancer Mother    High blood pressure (Hypertension) Mother    Hyperlipidemia (Elevated cholesterol) Mother    Coronary Artery Disease (Blocked arteries around heart) Mother    High blood pressure (Hypertension) Father    Hyperlipidemia (Elevated cholesterol) Father    Coronary Artery Disease (Blocked arteries around heart) Father    Skin cancer Brother    High blood pressure (Hypertension) Brother    Hyperlipidemia (Elevated cholesterol) Brother    Coronary Artery Disease (Blocked arteries around heart) Brother      Social History   Tobacco Use  Smoking Status Some Days   Types: Cigarettes  Smokeless Tobacco Never     Social History   Socioeconomic History   Marital status: Married  Tobacco Use   Smoking status: Some Days    Types: Cigarettes   Smokeless tobacco: Never  Substance and Sexual Activity   Alcohol use: Yes   Drug use: Never   Social Drivers of Health   Food Insecurity: No Food Insecurity (08/24/2023)  [facility]   Hunger Vital Sign    Worried About Running Out of Food in the Last Year: Never true    Ran Out of Food in the Last Year: Never true  Transportation Needs: No Transportation Needs (08/24/2023)   Received from [facility] - Transportation    Lack of Transportation (Medical): No    Lack of Transportation (Non-Medical): No  Housing Stability: Unknown (08/31/2023)   Housing Stability Vital Sign    Homeless in the Last Year: No    Objective:    Vitals:   08/31/23 0916 08/31/23 0917  BP: 118/74   Pulse: 83   Temp: 36.6 C (97.9 F)   SpO2: 99%   Weight: 68.3 kg (150 lb 9.6 oz)    Height: 170.2 cm (5\' 7" )   PainSc:  0-No pain     Exam Gen: NAD CV: RRR Pulm: CTA Abd: soft   Labs, Imaging and Diagnostic Testing: CEA <2  Assessment and Plan:  Diagnoses and all orders for this visit:  Colon cancer, ascending (CMS/HHS-HCC) -     polyethylene glycol (MIRALAX) powder; Take 233.75 g by mouth once for 1 dose Take according to your procedure prep instructions. -     bisacodyL (DULCOLAX) 5 mg EC tablet; Take 4 tablets (20 mg total) by mouth once daily as needed for Constipation for up to 1 dose -     metroNIDAZOLE (FLAGYL) 500 MG tablet; Take 2 tablets (1,000 mg total) by mouth 3 (three) times daily for 3 doses Take according to your procedure colon prep instructions -     neomycin 500 mg tablet; Take 2 tablets (1,000 mg total) by mouth 3 (three) times daily for 3 doses Take according to your procedure colon prep instructions -     ondansetron (ZOFRAN) 4 MG tablet; Take 1 tablet (4 mg total) by mouth 2 (two) times daily Take 1 tablet at 3pm and then 1 tablet at 32pm     78 year old healthy female who presents to the office with a adenocarcinoma found in a cecal polyp.  CT scan shows no evidence of metastatic disease.  We will proceed with formal right hemicolectomy and lymph node resection to completely evaluate this.  We discussed that this is the only way to know for sure that there is no additional tumor cells within the body.  We discussed typical postop recovery and healing times. The surgery and anatomy were described to the patient as well as the risks of surgery and the possible complications.  These include: Bleeding, deep abdominal infections and possible wound complications such as hernia and infection, damage to adjacent structures, leak of surgical connections, which can lead to other surgeries and possibly an ostomy, possible need for other procedures, such as abscess drains in radiology, possible prolonged hospital stay, possible diarrhea from removal of part  of the colon, possible constipation from narcotics, possible bowel, bladder or sexual dysfunction if having rectal surgery, prolonged fatigue/weakness or appetite loss, possible early recurrence of of disease, possible complications of their medical problems such as heart disease or arrhythmias or lung problems, death (less than 1%). I believe the patient understands and wishes to proceed with the surgery.   Vanita Panda, MD Colon and Rectal Surgery [facility]

## 2023-08-31 NOTE — H&P (Signed)
REFERRING PHYSICIAN:  Pyrtle, Burnard Leigh, MD  PROVIDER:  Elenora Gamma, MD  MRN: [id number] DOB: 1946-02-15 DATE OF ENCOUNTER: 08/31/2023  Subjective   Chief Complaint: NEW CANCER     History of Present Illness: Sherri Morris is a 78 y.o. female who is seen today as an office consultation at the request of Dr. Rhea Belton for evaluation of NEW CANCER .  78 year old female who underwent routine surveillance colonoscopy in early January 2025.  Multiple polyps were removed from the cecum transverse colon descending colon and sigmoid colon.  The cecal polyp showed invasive moderately differentiated adenocarcinoma.  CT chest abdomen and pelvis shows some thickening at the cecal base with clips marking the known malignancy site.  There are 2 small lesions in the liver that are too small to characterize.  No lymphadenopathy or metastatic disease noted.  CEA is normal.  Surgical history significant for laparoscopic cholecystectomy approximately 20 years ago.  She averages approximately 0 to 1 cigarettes/day.   Review of Systems: A complete review of systems was obtained from the patient.  I have reviewed this information and discussed as appropriate with the patient.  See HPI as well for other ROS.    Medical History: Past Medical History:  Diagnosis Date   Arthritis    GERD (gastroesophageal reflux disease)    History of cancer    Hyperlipidemia     There is no problem list on file for this patient.   Past Surgical History:  Procedure Laterality Date   ARTHROSCOPIC ROTATOR CUFF REPAIR     CHOLECYSTECTOMY     Cornea Transplants     Foot surgery       Allergies  Allergen Reactions   Pantoprazole Diarrhea    Current Outpatient Medications on File Prior to Visit  Medication Sig Dispense Refill   ascorbic acid, vitamin C, (VITAMIN C) 500 MG tablet Take 500 mg by mouth once daily     famotidine (PEPCID) 20 MG tablet Take 30 mg by mouth 2 (two) times daily     multivitamin  tablet Take 1 tablet by mouth once daily     PREMPRO 0.3-1.5 mg tablet TAKE 1 TABLET BY MOUTH EVERY DAY Orally Once a day for 90 days     rosuvastatin (CRESTOR) 10 MG tablet Take 10 mg by mouth once daily     No current facility-administered medications on file prior to visit.    Family History  Problem Relation Age of Onset   Skin cancer Mother    High blood pressure (Hypertension) Mother    Hyperlipidemia (Elevated cholesterol) Mother    Coronary Artery Disease (Blocked arteries around heart) Mother    High blood pressure (Hypertension) Father    Hyperlipidemia (Elevated cholesterol) Father    Coronary Artery Disease (Blocked arteries around heart) Father    Skin cancer Brother    High blood pressure (Hypertension) Brother    Hyperlipidemia (Elevated cholesterol) Brother    Coronary Artery Disease (Blocked arteries around heart) Brother      Social History   Tobacco Use  Smoking Status Some Days   Types: Cigarettes  Smokeless Tobacco Never     Social History   Socioeconomic History   Marital status: Married  Tobacco Use   Smoking status: Some Days    Types: Cigarettes   Smokeless tobacco: Never  Substance and Sexual Activity   Alcohol use: Yes   Drug use: Never   Social Drivers of Health   Food Insecurity: No Food Insecurity (08/24/2023)  Received from [facility]   Hunger Vital Sign    Worried About Running Out of Food in the Last Year: Never true    Ran Out of Food in the Last Year: Never true  Transportation Needs: No Transportation Needs (08/24/2023)   Received from [facility] - Transportation    Lack of Transportation (Medical): No    Lack of Transportation (Non-Medical): No  Housing Stability: Unknown (08/31/2023)   Housing Stability Vital Sign    Homeless in the Last Year: No    Objective:    Vitals:   08/31/23 0916 08/31/23 0917  BP: 118/74   Pulse: 83   Temp: 36.6 C (97.9 F)   SpO2: 99%   Weight: 68.3 kg (150 lb 9.6 oz)    Height: 170.2 cm (5\' 7" )   PainSc:  0-No pain     Exam Gen: NAD CV: RRR Pulm: CTA Abd: soft   Labs, Imaging and Diagnostic Testing: CEA <2  Assessment and Plan:  Diagnoses and all orders for this visit:  Colon cancer, ascending (CMS/HHS-HCC) -     polyethylene glycol (MIRALAX) powder; Take 233.75 g by mouth once for 1 dose Take according to your procedure prep instructions. -     bisacodyL (DULCOLAX) 5 mg EC tablet; Take 4 tablets (20 mg total) by mouth once daily as needed for Constipation for up to 1 dose -     metroNIDAZOLE (FLAGYL) 500 MG tablet; Take 2 tablets (1,000 mg total) by mouth 3 (three) times daily for 3 doses Take according to your procedure colon prep instructions -     neomycin 500 mg tablet; Take 2 tablets (1,000 mg total) by mouth 3 (three) times daily for 3 doses Take according to your procedure colon prep instructions -     ondansetron (ZOFRAN) 4 MG tablet; Take 1 tablet (4 mg total) by mouth 2 (two) times daily Take 1 tablet at 3pm and then 1 tablet at 20pm     78 year old healthy female who presents to the office with a adenocarcinoma found in a cecal polyp.  CT scan shows no evidence of metastatic disease.  We will proceed with formal right hemicolectomy and lymph node resection to completely evaluate this.  We discussed that this is the only way to know for sure that there is no additional tumor cells within the body.  We discussed typical postop recovery and healing times. The surgery and anatomy were described to the patient as well as the risks of surgery and the possible complications.  These include: Bleeding, deep abdominal infections and possible wound complications such as hernia and infection, damage to adjacent structures, leak of surgical connections, which can lead to other surgeries and possibly an ostomy, possible need for other procedures, such as abscess drains in radiology, possible prolonged hospital stay, possible diarrhea from removal of part  of the colon, possible constipation from narcotics, possible bowel, bladder or sexual dysfunction if having rectal surgery, prolonged fatigue/weakness or appetite loss, possible early recurrence of of disease, possible complications of their medical problems such as heart disease or arrhythmias or lung problems, death (less than 1%). I believe the patient understands and wishes to proceed with the surgery.   Vanita Panda, MD Colon and Rectal Surgery [facility]

## 2023-09-05 NOTE — Progress Notes (Signed)
COVID Vaccine received:  []  No [x]  Yes Date of any COVID positive Test in last 90 days:  PCP - Rodrigo Ran, MD  Cardiologist - Clotilde Dieter, DO  Chest x-ray -  EKG -  08-18-2022   Will repeat Stress Test - Eugenie Birks  09-01-2022  Epic ECHO - 08-20-2022  Epic Cardiac Cath -  CTA calcium score of 71 on 02-11-2021  PCR screen: []  Ordered & Completed [x]   No Order but Needs PROFEND     []   N/A for this surgery  Surgery Plan:  []  Ambulatory   []  Outpatient in bed  [x]  Admit Anesthesia:    [x]  General  []  Spinal  []   Choice []   MAC  Bowel Prep - []  No  [x]   Yes __CCS prep  Pacemaker / ICD device [x]  No []  Yes   Spinal Cord Stimulator:[x]  No []  Yes       History of Sleep Apnea? [x]  No []  Yes   CPAP used?- [x]  No []  Yes    Does the patient monitor blood sugar?   [x]  N/A   []  No []  Yes  Patient has: [x]  NO Hx DM   []  Pre-DM   []  DM1  []   DM2  Blood Thinner / Instructions:  none Aspirin Instructions:   none  ERAS Protocol Ordered: []  No  [x]  Yes PRE-SURGERY [x]  ENSURE  x3  Patient is to be NPO after:  1130  Dental hx: []  Dentures:  []  N/A      []  Bridge or Partial:                   []  Loose or Damaged teeth:   Comments:   Activity level: Patient is able / unable to climb a flight of stairs without difficulty; []  No CP  []  No SOB, but would have ___   Patient can / can not perform ADLs without assistance.   Anesthesia review: CAD, GERD, some days smoker, s/p corneal transplants RT 2012 & LT 2021  Patient denies shortness of breath, fever, cough and chest pain at PAT appointment.  Patient verbalized understanding and agreement to the Pre-Surgical Instructions that were given to them at this PAT appointment. Patient was also educated of the need to review these PAT instructions again prior to her surgery.I reviewed the appropriate phone numbers to call if they have any and questions or concerns.

## 2023-09-05 NOTE — Patient Instructions (Signed)
SURGICAL WAITING ROOM VISITATION Patients having surgery or a procedure may have no more than 2 support people in the waiting area - these visitors may rotate in the visitor waiting room.   Due to an increase in RSV and influenza rates and associated hospitalizations, children ages 48 and under may not visit patients in [facility] hospitals. If the patient needs to stay at the hospital during part of their recovery, the visitor guidelines for inpatient rooms apply.  PRE-OP VISITATION  Pre-op nurse will coordinate an appropriate time for 1 support person to accompany the patient in pre-op.  This support person may not rotate.  This visitor will be contacted when the time is appropriate for the visitor to come back in the pre-op area.  Please refer to the [facility] website for the visitor guidelines for Inpatients (after your surgery is over and you are in a regular room).  You are not required to quarantine at this time prior to your surgery. However, you must do this: Hand Hygiene often Do NOT share personal items Notify your provider if you are in close contact with someone who has COVID or you develop fever 100.4 or greater, new onset of sneezing, cough, sore throat, shortness of breath or body aches.  If you test positive for Covid or have been in contact with anyone that has tested positive in the last 10 days please notify you surgeon.    Your procedure is scheduled on:  THURSDAY  September 16, 2023  Report to [facility] Main Entrance: [address] entrance where the [employer] is available.   Report to admitting at: 12:15  PM  Call this number if you have any questions or problems the morning of surgery [phone]  FOLLOW ANY ADDITIONAL PRE OP INSTRUCTIONS YOU RECEIVED FROM YOUR SURGEON'S OFFICE!!! Dulcolax 20 mg (total) - Take 4 (four) of the 5 mg Dulcolax tablets with water at 07:00 am the day prior to surgery.  Miralax 255 g - Mix with 64 oz Gatorade/Powerade.   Starting at 10:00 am ,Drink this gradually over the next few hours (8 oz glass every 15-30 minutes) until gone the day prior to surgery You should finish in 4 hours-6 hours.    Neomycin 1000 mg - At 2 pm, 3 pm and 10 pm after Miralax  bowel prep the day prior to surgery.  Metronidazole 1000 mg - At 2 pm, 3 pm and 10 pm after Miralax bowel prep the day prior to surgery.   Drink plenty of clear liquids all evening to avoid getting dehydrated.  Marland Kitchen DRINK two (2) bottles of Pre-Surgery Clear Ensure or G2 drink starting at 6:00 pm the evening prior to your surgery to help prevent dehydration. Increase drinking clear fluids (see list below)          Do not eat food after Midnight the night prior to your surgery/procedure.  After Midnight you may have the following liquids until  11:30 AM DAY OF SURGERY  Clear Liquid Diet Water Black Coffee (sugar ok, NO MILK/CREAM OR CREAMERS)  Tea (sugar ok, NO MILK/CREAM OR CREAMERS) regular and decaf                             Plain Jell-O  with no fruit (NO RED)  Fruit ices (not with fruit pulp, NO RED)                                     Popsicles (NO RED)                                                                  Juice: NO CITRUS JUICES: only apple, WHITE grape, WHITE cranberry Sports drinks like Gatorade or Powerade (NO RED)                   The day of surgery:  Drink ONE (1) Pre-Surgery Clear Ensure at  11:30 AM the morning of surgery. Drink in one sitting. Do not sip.  This drink was given to you during your hospital pre-op appointment visit. Nothing else to drink after completing the Pre-Surgery Clear Ensure : No candy, chewing gum or throat lozenges.      Oral Hygiene is also important to reduce your risk of infection.        Remember - BRUSH YOUR TEETH THE MORNING OF SURGERY WITH YOUR REGULAR TOOTHPASTE  Do NOT smoke after Midnight the night before surgery.  STOP TAKING all Vitamins, Herbs  and supplements 1 week before your surgery.   Take ONLY these medicines the morning of surgery with A SIP OF WATER: Famotidine, You may take Tylenol if needed for pain.                     You may not have any metal on your body including hair pins, jewelry, and body piercing  Do not wear make-up, lotions, powders, perfumes or deodorant  Do not wear nail polish including gel and S&S, artificial / acrylic nails, or any other type of covering on natural nails including finger and toenails. If you have artificial nails, gel coating, etc., that needs to be removed by a nail salon, Please have this removed prior to surgery. Not doing so may mean that your surgery could be cancelled or delayed if the Surgeon or anesthesia staff feels like they are unable to monitor you safely.   Do not shave 48 hours prior to surgery to avoid nicks in your skin which may contribute to postoperative infections.    Contacts, Hearing Aids, dentures or bridgework may not be worn into surgery. DENTURES WILL BE REMOVED PRIOR TO SURGERY PLEASE DO NOT APPLY "Poly grip" OR ADHESIVES!!!  You may bring a small overnight bag with you on the day of surgery, only pack items that are not valuable. [facility] IS NOT RESPONSIBLE   FOR VALUABLES THAT ARE LOST OR STOLEN.   Do not bring your home medications to the hospital. The Pharmacy will dispense medications listed on your medication list to you during your admission in the Hospital.  Please read over the following fact sheets you were given: IF YOU HAVE QUESTIONS ABOUT YOUR PRE-OP INSTRUCTIONS, PLEASE CALL [phone]   [facility] - Preparing for Surgery Before surgery, you can play an important role.  Because skin is not sterile, your skin needs to be as free of germs as possible.  You can reduce the number of germs on your skin by washing with  CHG (chlorahexidine gluconate) soap before surgery.  CHG is an antiseptic cleaner which kills germs and bonds with the skin to  continue killing germs even after washing. Please DO NOT use if you have an allergy to CHG or antibacterial soaps.  If your skin becomes reddened/irritated stop using the CHG and inform your nurse when you arrive at Short Stay. Do not shave (including legs and underarms) for at least 48 hours prior to the first CHG shower.  You may shave your face/neck.  Please follow these instructions carefully:  1.  Shower with CHG Soap the night before surgery and the  morning of surgery.  2.  If you choose to wash your hair, wash your hair first as usual with your normal  shampoo.  3.  After you shampoo, rinse your hair and body thoroughly to remove the shampoo.                             4.  Use CHG as you would any other liquid soap.  You can apply chg directly to the skin and wash.  Gently with a scrungie or clean washcloth.  5.  Apply the CHG Soap to your body ONLY FROM THE NECK DOWN.   Do not use on face/ open                           Wound or open sores. Avoid contact with eyes, ears mouth and genitals (private parts).                       Wash face,  Genitals (private parts) with your normal soap.             6.  Wash thoroughly, paying special attention to the area where your  surgery  will be performed.  7.  Thoroughly rinse your body with warm water from the neck down.  8.  DO NOT shower/wash with your normal soap after using and rinsing off the CHG Soap.            9.  Pat yourself dry with a clean towel.            10.  Wear clean pajamas.            11.  Place clean sheets on your bed the night of your first shower and do not  sleep with pets.  ON THE DAY OF SURGERY : Do not apply any lotions/deodorants the morning of surgery.  Please wear clean clothes to the hospital/surgery center.     FAILURE TO FOLLOW THESE INSTRUCTIONS MAY RESULT IN THE CANCELLATION OF YOUR SURGERY  PATIENT SIGNATURE_________________________________  NURSE  SIGNATURE__________________________________  ________________________________________________________________________       Rogelia Mire    An incentive spirometer is a tool that can help keep your lungs clear and active. This tool measures how well you are filling your lungs with each breath. Taking long deep breaths may help reverse or decrease the chance of developing breathing (pulmonary) problems (especially infection) following: A long period of time when you are unable to move or be active. BEFORE THE PROCEDURE  If the spirometer includes an indicator to show your best effort, your nurse or respiratory therapist will set it to a desired goal. If possible, sit up straight or lean slightly forward. Try not to slouch. Hold the incentive spirometer in an upright position.  INSTRUCTIONS FOR USE  Sit on the edge of your bed if possible, or sit up as far as you can in bed or on a chair. Hold the incentive spirometer in an upright position. Breathe out normally. Place the mouthpiece in your mouth and seal your lips tightly around it. Breathe in slowly and as deeply as possible, raising the piston or the ball toward the top of the column. Hold your breath for 3-5 seconds or for as long as possible. Allow the piston or ball to fall to the bottom of the column. Remove the mouthpiece from your mouth and breathe out normally. Rest for a few seconds and repeat Steps 1 through 7 at least 10 times every 1-2 hours when you are awake. Take your time and take a few normal breaths between deep breaths. The spirometer may include an indicator to show your best effort. Use the indicator as a goal to work toward during each repetition. After each set of 10 deep breaths, practice coughing to be sure your lungs are clear. If you have an incision (the cut made at the time of surgery), support your incision when coughing by placing a pillow or rolled up towels firmly against it. Once you are able to  get out of bed, walk around indoors and cough well. You may stop using the incentive spirometer when instructed by your caregiver.  RISKS AND COMPLICATIONS Take your time so you do not get dizzy or light-headed. If you are in pain, you may need to take or ask for pain medication before doing incentive spirometry. It is harder to take a deep breath if you are having pain. AFTER USE Rest and breathe slowly and easily. It can be helpful to keep track of a log of your progress. Your caregiver can provide you with a simple table to help with this. If you are using the spirometer at home, follow these instructions: SEEK MEDICAL CARE IF:  You are having difficultly using the spirometer. You have trouble using the spirometer as often as instructed. Your pain medication is not giving enough relief while using the spirometer. You develop fever of 100.5 F (38.1 C) or higher.                                                                                                    SEEK IMMEDIATE MEDICAL CARE IF:  You cough up bloody sputum that had not been present before. You develop fever of 102 F (38.9 C) or greater. You develop worsening pain at or near the incision site. MAKE SURE YOU:  Understand these instructions. Will watch your condition. Will get help right away if you are not doing well or get worse. Document Released: 11/30/2006 Document Revised: 10/12/2011 Document Reviewed: 01/31/2007 [facility] Patient Information 2014 [place], [place].

## 2023-09-06 ENCOUNTER — Encounter (HOSPITAL_COMMUNITY): Payer: Self-pay

## 2023-09-06 ENCOUNTER — Other Ambulatory Visit: Payer: Self-pay

## 2023-09-06 ENCOUNTER — Encounter (HOSPITAL_COMMUNITY)
Admission: RE | Admit: 2023-09-06 | Discharge: 2023-09-06 | Disposition: A | Discharge status: Home / Self Care | Payer: Medicare PPO | Source: Ambulatory Visit | Attending: General Surgery | Admitting: General Surgery

## 2023-09-06 VITALS — BP 109/56 | HR 66 | Temp 98.2°F | Resp 16 | Ht 66.5 in | Wt 147.0 lb

## 2023-09-06 DIAGNOSIS — I2510 Atherosclerotic heart disease of native coronary artery without angina pectoris: Secondary | ICD-10-CM | POA: Insufficient documentation

## 2023-09-06 DIAGNOSIS — R9431 Abnormal electrocardiogram [ECG] [EKG]: Secondary | ICD-10-CM | POA: Insufficient documentation

## 2023-09-06 DIAGNOSIS — Z01818 Encounter for other preprocedural examination: Secondary | ICD-10-CM | POA: Insufficient documentation

## 2023-09-06 DIAGNOSIS — C180 Malignant neoplasm of cecum: Secondary | ICD-10-CM | POA: Insufficient documentation

## 2023-09-13 DIAGNOSIS — M25519 Pain in unspecified shoulder: Secondary | ICD-10-CM | POA: Diagnosis not present

## 2023-09-16 ENCOUNTER — Encounter (HOSPITAL_COMMUNITY): Payer: Self-pay | Admitting: General Surgery

## 2023-09-16 ENCOUNTER — Other Ambulatory Visit: Payer: Self-pay

## 2023-09-16 ENCOUNTER — Encounter (HOSPITAL_COMMUNITY)
Admission: RE | Disposition: A | Discharge status: Home / Self Care | Payer: Self-pay | Source: Ambulatory Visit | Attending: General Surgery

## 2023-09-16 ENCOUNTER — Inpatient Hospital Stay (HOSPITAL_COMMUNITY)
Admission: RE | Admit: 2023-09-16 | Discharge: 2023-09-20 | DRG: 331 | Disposition: A | Discharge status: Home / Self Care | Payer: Medicare PPO | Source: Ambulatory Visit | Attending: General Surgery | Admitting: General Surgery

## 2023-09-16 ENCOUNTER — Inpatient Hospital Stay (HOSPITAL_COMMUNITY): Payer: Medicare PPO | Admitting: Certified Registered Nurse Anesthetist

## 2023-09-16 DIAGNOSIS — Z79899 Other long term (current) drug therapy: Secondary | ICD-10-CM

## 2023-09-16 DIAGNOSIS — K219 Gastro-esophageal reflux disease without esophagitis: Secondary | ICD-10-CM | POA: Diagnosis present

## 2023-09-16 DIAGNOSIS — M1990 Unspecified osteoarthritis, unspecified site: Secondary | ICD-10-CM | POA: Diagnosis present

## 2023-09-16 DIAGNOSIS — F17210 Nicotine dependence, cigarettes, uncomplicated: Secondary | ICD-10-CM

## 2023-09-16 DIAGNOSIS — Z888 Allergy status to other drugs, medicaments and biological substances status: Secondary | ICD-10-CM

## 2023-09-16 DIAGNOSIS — I2510 Atherosclerotic heart disease of native coronary artery without angina pectoris: Secondary | ICD-10-CM | POA: Diagnosis not present

## 2023-09-16 DIAGNOSIS — C188 Malignant neoplasm of overlapping sites of colon: Principal | ICD-10-CM | POA: Diagnosis present

## 2023-09-16 DIAGNOSIS — K388 Other specified diseases of appendix: Secondary | ICD-10-CM | POA: Diagnosis not present

## 2023-09-16 DIAGNOSIS — C182 Malignant neoplasm of ascending colon: Principal | ICD-10-CM | POA: Diagnosis present

## 2023-09-16 DIAGNOSIS — Z8249 Family history of ischemic heart disease and other diseases of the circulatory system: Secondary | ICD-10-CM | POA: Diagnosis not present

## 2023-09-16 DIAGNOSIS — Z808 Family history of malignant neoplasm of other organs or systems: Secondary | ICD-10-CM | POA: Diagnosis not present

## 2023-09-16 DIAGNOSIS — F32A Depression, unspecified: Secondary | ICD-10-CM

## 2023-09-16 DIAGNOSIS — E785 Hyperlipidemia, unspecified: Secondary | ICD-10-CM | POA: Diagnosis present

## 2023-09-16 DIAGNOSIS — C180 Malignant neoplasm of cecum: Secondary | ICD-10-CM

## 2023-09-16 DIAGNOSIS — Z9049 Acquired absence of other specified parts of digestive tract: Secondary | ICD-10-CM

## 2023-09-16 LAB — TYPE AND SCREEN
ABO/RH(D): O NEG
Antibody Screen: NEGATIVE

## 2023-09-16 LAB — ABO/RH: ABO/RH(D): O NEG

## 2023-09-16 SURGERY — COLECTOMY, PARTIAL, ROBOT-ASSISTED, LAPAROSCOPIC
Anesthesia: General

## 2023-09-16 MED ORDER — ACETAMINOPHEN 500 MG PO TABS
1000.0000 mg | ORAL_TABLET | Freq: Four times a day (QID) | ORAL | Status: DC
  Administered 2023-09-16 – 2023-09-19 (×11): 1000 mg via ORAL
  Filled 2023-09-16 (×12): qty 2

## 2023-09-16 MED ORDER — LACTATED RINGERS IV SOLN: INTRAVENOUS | Status: DC

## 2023-09-16 MED ORDER — ONDANSETRON HCL 4 MG/2ML IJ SOLN
4.0000 mg | Freq: Four times a day (QID) | INTRAMUSCULAR | Status: DC | PRN
  Administered 2023-09-17: 4 mg via INTRAVENOUS
  Filled 2023-09-16 (×2): qty 2

## 2023-09-16 MED ORDER — PROPOFOL 10 MG/ML IV BOLUS
INTRAVENOUS | Status: DC | PRN
  Administered 2023-09-16: 120 mg via INTRAVENOUS

## 2023-09-16 MED ORDER — ENSURE PRE-SURGERY PO LIQD
296.0000 mL | Freq: Once | ORAL | Status: DC
  Filled 2023-09-16: qty 296

## 2023-09-16 MED ORDER — MIDAZOLAM HCL 5 MG/5ML IJ SOLN
INTRAMUSCULAR | Status: DC | PRN
  Administered 2023-09-16: 2 mg via INTRAVENOUS

## 2023-09-16 MED ORDER — ENSURE PRE-SURGERY PO LIQD
592.0000 mL | Freq: Once | ORAL | Status: DC
Start: 2023-09-16 — End: 2023-09-16
  Filled 2023-09-16: qty 592

## 2023-09-16 MED ORDER — ROCURONIUM BROMIDE 100 MG/10ML IV SOLN
INTRAVENOUS | Status: DC | PRN
Start: 2023-09-16 — End: 2023-09-16
  Administered 2023-09-16: 50 mg via INTRAVENOUS
  Administered 2023-09-16: 20 mg via INTRAVENOUS

## 2023-09-16 MED ORDER — ENSURE SURGERY PO LIQD: 237.0000 mL | Freq: Two times a day (BID) | ORAL | Status: DC

## 2023-09-16 MED ORDER — SODIUM CHLORIDE 0.9 % IV SOLN
2.0000 g | INTRAVENOUS | Status: AC
  Administered 2023-09-16: 2 g via INTRAVENOUS
  Filled 2023-09-16: qty 2

## 2023-09-16 MED ORDER — OXYCODONE HCL 5 MG PO TABS: 5.0000 mg | ORAL_TABLET | Freq: Once | ORAL | Status: DC | PRN

## 2023-09-16 MED ORDER — GABAPENTIN 300 MG PO CAPS
300.0000 mg | ORAL_CAPSULE | ORAL | Status: AC
  Administered 2023-09-16: 300 mg via ORAL
  Filled 2023-09-16: qty 1

## 2023-09-16 MED ORDER — MIDAZOLAM HCL 2 MG/2ML IJ SOLN
INTRAMUSCULAR | Status: AC
  Filled 2023-09-16: qty 2

## 2023-09-16 MED ORDER — ONDANSETRON HCL 4 MG/2ML IJ SOLN
INTRAMUSCULAR | Status: DC | PRN
  Administered 2023-09-16: 4 mg via INTRAVENOUS

## 2023-09-16 MED ORDER — KCL IN DEXTROSE-NACL 20-5-0.45 MEQ/L-%-% IV SOLN
INTRAVENOUS | Status: DC
  Filled 2023-09-16 (×2): qty 1000

## 2023-09-16 MED ORDER — FENTANYL CITRATE PF 50 MCG/ML IJ SOSY
25.0000 ug | PREFILLED_SYRINGE | INTRAMUSCULAR | Status: DC | PRN
  Administered 2023-09-16 (×2): 50 ug via INTRAVENOUS

## 2023-09-16 MED ORDER — SUGAMMADEX SODIUM 200 MG/2ML IV SOLN
INTRAVENOUS | Status: DC | PRN
Start: 2023-09-16 — End: 2023-09-16
  Administered 2023-09-16: 200 mg via INTRAVENOUS

## 2023-09-16 MED ORDER — LIDOCAINE HCL (CARDIAC) PF 100 MG/5ML IV SOSY
PREFILLED_SYRINGE | INTRAVENOUS | Status: DC | PRN
  Administered 2023-09-16: 60 mg via INTRAVENOUS

## 2023-09-16 MED ORDER — RINGERS IRRIGATION IR SOLN
Status: DC | PRN
  Administered 2023-09-16: 1000 mL

## 2023-09-16 MED ORDER — ROSUVASTATIN CALCIUM 10 MG PO TABS
10.0000 mg | ORAL_TABLET | Freq: Every day | ORAL | Status: DC
Start: 2023-09-16 — End: 2023-09-20
  Administered 2023-09-16 – 2023-09-19 (×4): 10 mg via ORAL
  Filled 2023-09-16 (×4): qty 1

## 2023-09-16 MED ORDER — DEXAMETHASONE SODIUM PHOSPHATE 10 MG/ML IJ SOLN
INTRAMUSCULAR | Status: DC | PRN
  Administered 2023-09-16: 5 mg via INTRAVENOUS

## 2023-09-16 MED ORDER — OXYCODONE HCL 5 MG/5ML PO SOLN: 5.0000 mg | Freq: Once | ORAL | Status: DC | PRN

## 2023-09-16 MED ORDER — BUPIVACAINE LIPOSOME 1.3 % IJ SUSP: 20.0000 mL | Freq: Once | INTRAMUSCULAR | Status: DC

## 2023-09-16 MED ORDER — GABAPENTIN 100 MG PO CAPS
300.0000 mg | ORAL_CAPSULE | Freq: Two times a day (BID) | ORAL | Status: DC
  Administered 2023-09-16 – 2023-09-20 (×8): 300 mg via ORAL
  Filled 2023-09-16 (×8): qty 3

## 2023-09-16 MED ORDER — BUPIVACAINE-EPINEPHRINE (PF) 0.25% -1:200000 IJ SOLN
INTRAMUSCULAR | Status: DC | PRN
  Administered 2023-09-16: 50 mL

## 2023-09-16 MED ORDER — ALUM & MAG HYDROXIDE-SIMETH 200-200-20 MG/5ML PO SUSP: 30.0000 mL | Freq: Four times a day (QID) | ORAL | Status: DC | PRN

## 2023-09-16 MED ORDER — CHLORHEXIDINE GLUCONATE 0.12 % MT SOLN
15.0000 mL | Freq: Once | OROMUCOSAL | Status: AC
  Administered 2023-09-16: 15 mL via OROMUCOSAL

## 2023-09-16 MED ORDER — EPHEDRINE SULFATE (PRESSORS) 50 MG/ML IJ SOLN: INTRAMUSCULAR | Status: DC | PRN

## 2023-09-16 MED ORDER — DEXAMETHASONE SODIUM PHOSPHATE 10 MG/ML IJ SOLN
INTRAMUSCULAR | Status: AC
  Filled 2023-09-16: qty 1

## 2023-09-16 MED ORDER — ENOXAPARIN SODIUM 40 MG/0.4ML IJ SOSY
40.0000 mg | PREFILLED_SYRINGE | INTRAMUSCULAR | Status: DC
  Administered 2023-09-17 – 2023-09-19 (×3): 40 mg via SUBCUTANEOUS
  Filled 2023-09-16 (×4): qty 0.4

## 2023-09-16 MED ORDER — SODIUM CHLORIDE 0.9 % IV SOLN
2.0000 g | Freq: Two times a day (BID) | INTRAVENOUS | Status: AC
  Administered 2023-09-17: 2 g via INTRAVENOUS
  Filled 2023-09-16: qty 2

## 2023-09-16 MED ORDER — ALVIMOPAN 12 MG PO CAPS
12.0000 mg | ORAL_CAPSULE | Freq: Two times a day (BID) | ORAL | Status: DC
Start: 2023-09-17 — End: 2023-09-18
  Administered 2023-09-17: 12 mg via ORAL
  Filled 2023-09-16 (×3): qty 1

## 2023-09-16 MED ORDER — FENTANYL CITRATE (PF) 100 MCG/2ML IJ SOLN
INTRAMUSCULAR | Status: DC | PRN
  Administered 2023-09-16: 50 ug via INTRAVENOUS
  Administered 2023-09-16: 100 ug via INTRAVENOUS
  Administered 2023-09-16: 50 ug via INTRAVENOUS

## 2023-09-16 MED ORDER — 0.9 % SODIUM CHLORIDE (POUR BTL) OPTIME
TOPICAL | Status: DC | PRN
Start: 2023-09-16 — End: 2023-09-16
  Administered 2023-09-16: 2000 mL

## 2023-09-16 MED ORDER — EPHEDRINE SULFATE-NACL 50-0.9 MG/10ML-% IV SOSY
PREFILLED_SYRINGE | INTRAVENOUS | Status: DC | PRN
  Administered 2023-09-16 (×4): 5 mg via INTRAVENOUS

## 2023-09-16 MED ORDER — BUPIVACAINE LIPOSOME 1.3 % IJ SUSP
INTRAMUSCULAR | Status: AC
Start: 2023-09-16 — End: ?
  Filled 2023-09-16: qty 20

## 2023-09-16 MED ORDER — POLYETHYLENE GLYCOL 3350 17 GM/SCOOP PO POWD
1.0000 | Freq: Once | ORAL | Status: DC
  Filled 2023-09-16: qty 255

## 2023-09-16 MED ORDER — ACETAMINOPHEN 500 MG PO TABS
1000.0000 mg | ORAL_TABLET | ORAL | Status: AC
  Administered 2023-09-16: 1000 mg via ORAL
  Filled 2023-09-16: qty 2

## 2023-09-16 MED ORDER — LIDOCAINE HCL (PF) 2 % IJ SOLN
INTRAMUSCULAR | Status: AC
Start: 2023-09-16 — End: ?
  Filled 2023-09-16: qty 5

## 2023-09-16 MED ORDER — ORAL CARE MOUTH RINSE: 15.0000 mL | Freq: Once | OROMUCOSAL | Status: AC

## 2023-09-16 MED ORDER — ONDANSETRON HCL 4 MG/2ML IJ SOLN: 4.0000 mg | Freq: Four times a day (QID) | INTRAMUSCULAR | Status: DC | PRN

## 2023-09-16 MED ORDER — CONJ ESTROG-MEDROXYPROGEST ACE 0.3-1.5 MG PO TABS
1.0000 | ORAL_TABLET | Freq: Every morning | ORAL | Status: DC
  Administered 2023-09-18 – 2023-09-20 (×3): 1 via ORAL

## 2023-09-16 MED ORDER — ONDANSETRON HCL 4 MG/2ML IJ SOLN
INTRAMUSCULAR | Status: AC
Start: 2023-09-16 — End: ?
  Filled 2023-09-16: qty 2

## 2023-09-16 MED ORDER — BUPIVACAINE-EPINEPHRINE 0.25% -1:200000 IJ SOLN
INTRAMUSCULAR | Status: AC
Start: 2023-09-16 — End: ?
  Filled 2023-09-16: qty 1

## 2023-09-16 MED ORDER — ALVIMOPAN 12 MG PO CAPS
12.0000 mg | ORAL_CAPSULE | ORAL | Status: AC
  Administered 2023-09-16: 12 mg via ORAL
  Filled 2023-09-16: qty 1

## 2023-09-16 MED ORDER — DIPHENHYDRAMINE HCL 50 MG/ML IJ SOLN: 12.5000 mg | Freq: Four times a day (QID) | INTRAMUSCULAR | Status: DC | PRN

## 2023-09-16 MED ORDER — TRAMADOL HCL 50 MG PO TABS
50.0000 mg | ORAL_TABLET | Freq: Four times a day (QID) | ORAL | Status: DC | PRN
Start: 2023-09-16 — End: 2023-09-20
  Administered 2023-09-16 – 2023-09-17 (×4): 50 mg via ORAL
  Filled 2023-09-16 (×2): qty 1
  Filled 2023-09-16: qty 2
  Filled 2023-09-16: qty 1

## 2023-09-16 MED ORDER — FAMOTIDINE 20 MG PO TABS
40.0000 mg | ORAL_TABLET | Freq: Two times a day (BID) | ORAL | Status: DC
  Administered 2023-09-16 – 2023-09-20 (×8): 40 mg via ORAL
  Filled 2023-09-16 (×8): qty 2

## 2023-09-16 MED ORDER — BISACODYL 5 MG PO TBEC: 20.0000 mg | DELAYED_RELEASE_TABLET | Freq: Once | ORAL | Status: DC

## 2023-09-16 MED ORDER — ROCURONIUM BROMIDE 10 MG/ML (PF) SYRINGE
PREFILLED_SYRINGE | INTRAVENOUS | Status: AC
Start: 2023-09-16 — End: ?
  Filled 2023-09-16: qty 10

## 2023-09-16 MED ORDER — FENTANYL CITRATE (PF) 100 MCG/2ML IJ SOLN
INTRAMUSCULAR | Status: AC
  Filled 2023-09-16: qty 2

## 2023-09-16 MED ORDER — SIMETHICONE 80 MG PO CHEW
40.0000 mg | CHEWABLE_TABLET | Freq: Four times a day (QID) | ORAL | Status: DC | PRN
  Administered 2023-09-16 – 2023-09-17 (×2): 40 mg via ORAL
  Filled 2023-09-16 (×3): qty 1

## 2023-09-16 MED ORDER — FENTANYL CITRATE PF 50 MCG/ML IJ SOSY
PREFILLED_SYRINGE | INTRAMUSCULAR | Status: AC
Start: 2023-09-16 — End: 2023-09-17
  Filled 2023-09-16: qty 2

## 2023-09-16 MED ORDER — PROPOFOL 10 MG/ML IV BOLUS
INTRAVENOUS | Status: AC
  Filled 2023-09-16: qty 20

## 2023-09-16 MED ORDER — DIPHENHYDRAMINE HCL 12.5 MG/5ML PO ELIX: 12.5000 mg | ORAL_SOLUTION | Freq: Four times a day (QID) | ORAL | Status: DC | PRN

## 2023-09-16 MED ORDER — ONDANSETRON HCL 4 MG PO TABS: 4.0000 mg | ORAL_TABLET | Freq: Four times a day (QID) | ORAL | Status: DC | PRN

## 2023-09-16 MED ORDER — SACCHAROMYCES BOULARDII 250 MG PO CAPS
250.0000 mg | ORAL_CAPSULE | Freq: Two times a day (BID) | ORAL | Status: DC
  Administered 2023-09-16 – 2023-09-20 (×8): 250 mg via ORAL
  Filled 2023-09-16 (×8): qty 1

## 2023-09-16 MED ORDER — HYDROMORPHONE HCL 1 MG/ML IJ SOLN: 0.5000 mg | INTRAMUSCULAR | Status: DC | PRN

## 2023-09-16 SURGICAL SUPPLY — 73 items
BAG COUNTER SPONGE SURGICOUNT (BAG) ×1 IMPLANT
BLADE EXTENDED COATED 6.5IN (ELECTRODE) IMPLANT
CANNULA REDUCER 12-8 DVNC XI (CANNULA) IMPLANT
COVER SURGICAL LIGHT HANDLE (MISCELLANEOUS) ×2 IMPLANT
COVER TIP SHEARS 8 DVNC (MISCELLANEOUS) ×1 IMPLANT
DERMABOND ADVANCED .7 DNX12 (GAUZE/BANDAGES/DRESSINGS) IMPLANT
DRAIN CHANNEL 19F RND (DRAIN) IMPLANT
DRAPE ARM DVNC X/XI (DISPOSABLE) ×4 IMPLANT
DRAPE COLUMN DVNC XI (DISPOSABLE) ×1 IMPLANT
DRAPE SURG IRRIG POUCH 19X23 (DRAPES) ×1 IMPLANT
DRIVER NDL LRG 8 DVNC XI (INSTRUMENTS) ×1 IMPLANT
DRIVER NDLE LRG 8 DVNC XI (INSTRUMENTS) ×1
DRSG OPSITE POSTOP 4X6 (GAUZE/BANDAGES/DRESSINGS) IMPLANT
DRSG OPSITE POSTOP 4X8 (GAUZE/BANDAGES/DRESSINGS) IMPLANT
ELECT PENCIL ROCKER SW 15FT (MISCELLANEOUS) ×1 IMPLANT
ELECT REM PT RETURN 15FT ADLT (MISCELLANEOUS) ×1 IMPLANT
EVACUATOR SILICONE 100CC (DRAIN) IMPLANT
GLOVE BIO SURGEON STRL SZ 6.5 (GLOVE) ×3 IMPLANT
GLOVE INDICATOR 6.5 STRL GRN (GLOVE) ×3 IMPLANT
GOWN SRG XL LVL 4 BRTHBL STRL (GOWNS) ×1 IMPLANT
GOWN STRL REUS W/ TWL XL LVL3 (GOWN DISPOSABLE) ×3 IMPLANT
GRASPER SUT TROCAR 14GX15 (MISCELLANEOUS) IMPLANT
GRASPER TIP-UP FEN DVNC XI (INSTRUMENTS) ×1 IMPLANT
HOLDER FOLEY CATH W/STRAP (MISCELLANEOUS) ×1 IMPLANT
IRRIG SUCT STRYKERFLOW 2 WTIP (MISCELLANEOUS) ×1
IRRIGATION SUCT STRKRFLW 2 WTP (MISCELLANEOUS) ×1 IMPLANT
KIT PROCEDURE DVNC SI (MISCELLANEOUS) IMPLANT
KIT TURNOVER KIT A (KITS) IMPLANT
NDL INSUFFLATION 14GA 120MM (NEEDLE) ×1 IMPLANT
NEEDLE INSUFFLATION 14GA 120MM (NEEDLE) ×1
PACK CARDIOVASCULAR III (CUSTOM PROCEDURE TRAY) ×1 IMPLANT
PACK COLON (CUSTOM PROCEDURE TRAY) ×1 IMPLANT
PAD POSITIONING PINK XL (MISCELLANEOUS) ×1 IMPLANT
RELOAD STAPLE 60 2.5 WHT DVNC (STAPLE) IMPLANT
RELOAD STAPLE 60 3.5 BLU DVNC (STAPLE) IMPLANT
RELOAD STAPLE 60 4.3 GRN DVNC (STAPLE) IMPLANT
RETRACTOR WND ALEXIS 18 MED (MISCELLANEOUS) IMPLANT
RTRCTR WOUND ALEXIS 18CM MED (MISCELLANEOUS)
SCISSORS LAP 5X35 DISP (ENDOMECHANICALS) IMPLANT
SCISSORS MNPLR CVD DVNC XI (INSTRUMENTS) ×1 IMPLANT
SEAL UNIV 5-12 XI (MISCELLANEOUS) ×3 IMPLANT
SEALER VESSEL EXT DVNC XI (MISCELLANEOUS) ×1 IMPLANT
SOL ELECTROSURG ANTI STICK (MISCELLANEOUS) ×1
SOLUTION ELECTROSURG ANTI STCK (MISCELLANEOUS) ×1 IMPLANT
SPIKE FLUID TRANSFER (MISCELLANEOUS) IMPLANT
STAPLER 60 SUREFORM DVNC (STAPLE) IMPLANT
STAPLER ECHELON POWER CIR 29 (STAPLE) IMPLANT
STAPLER ECHELON POWER CIR 31 (STAPLE) IMPLANT
STAPLER RELOAD 2.5X60 WHT DVNC (STAPLE) ×1
STAPLER RELOAD 3.5X60 BLU DVNC (STAPLE) ×2
STAPLER RELOAD 4.3X60 GRN DVNC (STAPLE)
SUT ETHILON 2 0 PS N (SUTURE) IMPLANT
SUT NOVA NAB GS-21 1 T12 (SUTURE) ×2 IMPLANT
SUT PROLENE 2 0 KS (SUTURE) IMPLANT
SUT SILK 2 0 SH CR/8 (SUTURE) IMPLANT
SUT SILK 2-0 18XBRD TIE 12 (SUTURE) ×1 IMPLANT
SUT SILK 3 0 SH CR/8 (SUTURE) ×1 IMPLANT
SUT SILK 3-0 18XBRD TIE 12 (SUTURE) IMPLANT
SUT V-LOC BARB 180 2/0GR6 GS22 (SUTURE) ×2
SUT VIC AB 2-0 SH 18 (SUTURE) IMPLANT
SUT VIC AB 2-0 SH 27X BRD (SUTURE) IMPLANT
SUT VIC AB 3-0 SH 18 (SUTURE) IMPLANT
SUT VIC AB 4-0 PS2 27 (SUTURE) ×2 IMPLANT
SUT VICRYL 0 UR6 27IN ABS (SUTURE) ×1 IMPLANT
SUTURE V-LC BRB 180 2/0GR6GS22 (SUTURE) IMPLANT
SYR 20ML ECCENTRIC (SYRINGE) ×1 IMPLANT
SYS WOUND ALEXIS 18CM MED (MISCELLANEOUS)
SYSTEM WOUND ALEXIS 18CM MED (MISCELLANEOUS) IMPLANT
TOWEL OR 17X26 10 PK STRL BLUE (TOWEL DISPOSABLE) IMPLANT
TRAY FOLEY MTR SLVR 16FR STAT (SET/KITS/TRAYS/PACK) ×1 IMPLANT
TROCAR ADV FIXATION 5X100MM (TROCAR) ×1 IMPLANT
TUBING CONNECTING 10 (TUBING) ×2 IMPLANT
TUBING INSUFFLATION 10FT LAP (TUBING) ×1 IMPLANT

## 2023-09-16 NOTE — Anesthesia Procedure Notes (Signed)
Procedure Name: Intubation Date/Time: 09/16/2023 1:46 PM  Performed by: Dennison Nancy, CRNAPre-anesthesia Checklist: Patient identified, Emergency Drugs available, Suction available, Patient being monitored and Timeout performed Patient Re-evaluated:Patient Re-evaluated prior to induction Oxygen Delivery Method: Circle system utilized Preoxygenation: Pre-oxygenation with 100% oxygen Induction Type: IV induction Ventilation: Mask ventilation without difficulty Laryngoscope Size: Mac and 3 Grade View: Grade I Tube type: Oral Tube size: 7.0 mm Number of attempts: 1 Airway Equipment and Method: Stylet Placement Confirmation: ETT inserted through vocal cords under direct vision, positive ETCO2, CO2 detector and breath sounds checked- equal and bilateral Secured at: 22 cm Tube secured with: Tape (pink Hy-tape) Dental Injury: Teeth and Oropharynx as per pre-operative assessment

## 2023-09-16 NOTE — Interval H&P Note (Signed)
History and Physical Interval Note:  09/16/2023 12:08 PM  Sherri Morris  has presented today for surgery, with the diagnosis of CECAL CANCER.  The various methods of treatment have been discussed with the patient and family. After consideration of risks, benefits and other options for treatment, the patient has consented to  Procedure(s): XI ROBOT ASSISTED LAPAROSCOPIC PARTIAL COLECTOMY (N/A) as a surgical intervention.  The patient's history has been reviewed, patient examined, no change in status, stable for surgery.  I have reviewed the patient's chart and labs.  Questions were answered to the patient's satisfaction.     Vanita Panda, MD  Colorectal and General Surgery [facility]

## 2023-09-16 NOTE — Op Note (Signed)
09/16/2023  3:29 PM  PATIENT:  Sherri Morris  78 y.o. female  Patient Care Team: Rodrigo Ran, MD as PCP - General (Internal Medicine) Pyrtle, Carie Caddy, MD as Consulting Physician (Gastroenterology) Malachy Mood, MD as Consulting Physician (Hematology and Oncology)  PRE-OPERATIVE DIAGNOSIS:  CECAL CANCER  POST-OPERATIVE DIAGNOSIS:  CECAL CANCER  PROCEDURE:  XI ROBOT ASSISTED RIGHT COLECTOMY    Surgeon(s): Karie Soda, MD Romie Levee, MD  ASSISTANT: Dr Michaell Cowing   ANESTHESIA:   local and general  EBL: 15ml Total I/O In: 100 [IV Piggyback:100] Out: 15 [Blood:15]  Delay start of Pharmacological VTE agent (>24hrs) due to surgical blood loss or risk of bleeding:  no  DRAINS: none   SPECIMEN:  Source of Specimen:  R colon  DISPOSITION OF SPECIMEN:  PATHOLOGY  COUNTS:  YES  PLAN OF CARE: Admit to inpatient   PATIENT DISPOSITION:  PACU - hemodynamically stable.  INDICATION:    78 y.o. F with colon cancer noted in a polyp resection from the cecum.  I recommended segmental resection:  The anatomy & physiology of the digestive tract was discussed.  The pathophysiology was discussed.  Natural history risks without surgery was discussed.   I worked to give an overview of the disease and the frequent need to have multispecialty involvement.  I feel the risks of no intervention will lead to serious problems that outweigh the operative risks; therefore, I recommended a partial colectomy to remove the pathology.  Laparoscopic & open techniques were discussed.   Risks such as bleeding, infection, abscess, leak, reoperation, possible ostomy, hernia, heart attack, death, and other risks were discussed.  I noted a good likelihood this will help address the problem.   Goals of post-operative recovery were discussed as well.    The patient expressed understanding & wished to proceed with surgery.  OR FINDINGS:   Patient had normal appearing anatomy  No obvious metastatic disease on  visceral parietal peritoneum or liver.  DESCRIPTION:   Informed consent was confirmed.  The patient underwent general anaesthesia without difficulty.  The patient was positioned appropriately.  VTE prevention in place.  The patient's abdomen was clipped, prepped, & draped in a sterile fashion.  Surgical timeout confirmed our plan.  The patient was positioned in reverse Trendelenburg.  Abdominal entry was gained using a Varies in the LUQ.  Entry was clean.  I induced carbon dioxide insufflation.  An 8mm robotic port was placed in the LUQ.  Camera inspection revealed no injury.  Extra ports were carefully placed under direct laparoscopic visualization.  I laparoscopically reflected the greater omentum and the upper abdomen the small bowel in the peilvis. The patient was appropriately positioned and the robot was docked to the patient's right side.  Instruments were placed under direct visualization.    I began by identifying the ileocolic artery and vein within the mesentery. Dissection was bluntly carried around these structures. The duodenum was identified and free from the structures. I then separated the structures bluntly and used the robotic vessel sealer device to transect these.  I developed the retroperitoneal plane bluntly.  I then freed the appendix off its attachments to the pelvic wall. I mobilized the terminal ileum.  I took care to avoid injuring any retroperitoneal structures.  After this I began to mobilize laterally down the white line of Toldt and then took down the hepatic flexure using the robotic vessel sealing device. I mobilized the omentum off of the right transverse colon. The entire colon was then  flipped medially and mobilized off of the retroperitoneal structures until I could visualize the lateral edge of the duodenum underneath.  I gently freed the duodenal attachments.   I identified a portion of mesentery of the transverse colon just proximal to the right branch of the middle  colic.  I divided up to the colon from the previous dissection of the mesentery using the robotic vessel sealer.  I then divided the terminal ileal mesentery in similar fashion.  At that point, the terminal ileum was divided with a blue load robotic 60 mm stapler.  The transverse colon was divided with a blue load robotic stapler.  The specimen was then completely free and placed in the right upper quadrant.  Hemostasis was good.  I then oriented the remaining terminal ileum and transverse colon and a isoperistaltic fashion.  I placed an enterotomy in the small bowel and colon using the robotic scissors.  I then introduced a white load 60 mm robotic stapler into both enterotomies and created an anastomosis between the small bowel and transverse colon.  Hemostasis within the staple line was good.  The common enterotomy channel was closed using 2 running 2-0 V-Loc sutures. The omentum was then brought down over the anastomosis.  At this point the robot was undocked.  The 12 mm suprapubic port was enlarged to a Pfannenstiel incision and an Alexis wound protector was placed.  The specimen was removed from the abdomen and evaluated.  Once the abdomen was inspected for hemostasis, the Alexis wound protector was removed.    The peritoneum of the Pfannenstiel incision was closed using a running 0 Vicryl suture.  The fascia was then closed using #1 Novafil interrupted sutures.  The subcutaneous tissue of the extraction incision was closed using a running 2-0 Vicryl suture. The skin was then closed using running subcuticular 4-0 Vicryl sutures.  A sterile dressing was applied.  The remaining port sites were closed using interrupted 4-0 Vicryl sutures and Dermabond. All counts were correct per operating room staff. The patient was then awakened from anesthesia and sent to the post anesthesia care unit in stable condition.     Vanita Panda, MD  Colorectal and General Surgery [facility]

## 2023-09-16 NOTE — Transfer of Care (Signed)
Immediate Anesthesia Transfer of Care Note  Patient: Sherri Morris  Procedure(s) Performed: XI ROBOT ASSISTED LAPAROSCOPIC RIGHT PARTIAL COLECTOMY  Patient Location: PACU  Anesthesia Type:General  Level of Consciousness: awake, drowsy, and responds to stimulation  Airway & Oxygen Therapy: Patient Spontanous Breathing and Patient connected to face mask oxygen  Post-op Assessment: Report given to RN and Post -op Vital signs reviewed and stable  Post vital signs: Reviewed and stable  Last Vitals:  Vitals Value Taken Time  BP 136/67 09/16/23 1542  Temp    Pulse 67 09/16/23 1544  Resp    SpO2 100 % 09/16/23 1544  Vitals shown include unfiled device data.  Last Pain:  Vitals:   09/16/23 1120  TempSrc: Oral  PainSc:          Complications: No notable events documented.

## 2023-09-16 NOTE — Anesthesia Postprocedure Evaluation (Signed)
Anesthesia Post Note  Patient: Sherri Morris  Procedure(s) Performed: XI ROBOT ASSISTED LAPAROSCOPIC RIGHT PARTIAL COLECTOMY     Patient location during evaluation: PACU Anesthesia Type: General Level of consciousness: awake and alert Pain management: pain level controlled Vital Signs Assessment: post-procedure vital signs reviewed and stable Respiratory status: spontaneous breathing, nonlabored ventilation and respiratory function stable Cardiovascular status: blood pressure returned to baseline Postop Assessment: no apparent nausea or vomiting Anesthetic complications: no   No notable events documented.  Last Vitals:  Vitals:   09/16/23 1637 09/16/23 1645  BP:  117/66  Pulse:  71  Resp:  (!) 21  Temp:    SpO2: 100% 100%    Last Pain:  Vitals:   09/16/23 1626  TempSrc:   PainSc: 5                  Shanda Howells

## 2023-09-16 NOTE — Anesthesia Preprocedure Evaluation (Signed)
Anesthesia Evaluation  Patient identified by MRN, date of birth, ID band Patient awake    Reviewed: Allergy & Precautions, H&P , NPO status , Patient's Chart, lab work & pertinent test results  Airway Mallampati: II   Neck ROM: full    Dental   Pulmonary Current Smoker   breath sounds clear to auscultation       Cardiovascular + CAD   Rhythm:regular Rate:Normal     Neuro/Psych  PSYCHIATRIC DISORDERS  Depression       GI/Hepatic ,GERD  ,,  Endo/Other    Renal/GU      Musculoskeletal  (+) Arthritis ,    Abdominal   Peds  Hematology   Anesthesia Other Findings   Reproductive/Obstetrics                             Anesthesia Physical Anesthesia Plan  ASA: 3  Anesthesia Plan: General   Post-op Pain Management:    Induction: Intravenous  PONV Risk Score and Plan: 2 and Ondansetron, Dexamethasone and Treatment may vary due to age or medical condition  Airway Management Planned: Oral ETT  Additional Equipment:   Intra-op Plan:   Post-operative Plan: Extubation in OR  Informed Consent: I have reviewed the patients History and Physical, chart, labs and discussed the procedure including the risks, benefits and alternatives for the proposed anesthesia with the patient or authorized representative who has indicated his/her understanding and acceptance.     Dental advisory given  Plan Discussed with: CRNA, Anesthesiologist and Surgeon  Anesthesia Plan Comments:        Anesthesia Quick Evaluation

## 2023-09-17 LAB — BASIC METABOLIC PANEL
Anion gap: 8 (ref 5–15)
BUN: 7 mg/dL — ABNORMAL LOW (ref 8–23)
CO2: 22 mmol/L (ref 22–32)
Calcium: 8.7 mg/dL — ABNORMAL LOW (ref 8.9–10.3)
Chloride: 103 mmol/L (ref 98–111)
Creatinine, Ser: 0.6 mg/dL (ref 0.44–1.00)
GFR, Estimated: >60 mL/min (ref >60)
Glucose, Bld: 132 mg/dL — ABNORMAL HIGH (ref 70–99)
Potassium: 4 mmol/L (ref 3.5–5.1)
Sodium: 133 mmol/L — ABNORMAL LOW (ref 135–145)

## 2023-09-17 LAB — CBC
HCT: 37.4 % (ref 36.0–46.0)
Hemoglobin: 12 g/dL (ref 12.0–15.0)
MCH: 31.3 pg (ref 26.0–34.0)
MCHC: 32.1 g/dL (ref 30.0–36.0)
MCV: 97.4 fL (ref 80.0–100.0)
Platelets: 204 10*3/uL (ref 150–400)
RBC: 3.84 MIL/uL — ABNORMAL LOW (ref 3.87–5.11)
RDW: 12.6 % (ref 11.5–15.5)
WBC: 9.1 10*3/uL (ref 4.0–10.5)
nRBC: 0 % (ref 0.0–0.2)

## 2023-09-17 MED ORDER — TRAMADOL HCL 50 MG PO TABS
50.0000 mg | ORAL_TABLET | Freq: Four times a day (QID) | ORAL | 0 refills | Status: DC | PRN
Start: 2023-09-17 — End: 2023-11-02

## 2023-09-17 MED ORDER — KCL IN DEXTROSE-NACL 20-5-0.45 MEQ/L-%-% IV SOLN
INTRAVENOUS | Status: DC
  Filled 2023-09-17 (×2): qty 1000

## 2023-09-17 NOTE — Progress Notes (Signed)
   09/17/23 1429  TOC Brief Assessment  Insurance and Status Reviewed  Patient has primary care physician Yes  Home environment has been reviewed home with spouse  Prior level of function: independent  Prior/Current Home Services No current home services  Social Drivers of Health Review SDOH reviewed no interventions necessary  Readmission risk has been reviewed Yes  Transition of care needs no transition of care needs at this time

## 2023-09-17 NOTE — Progress Notes (Signed)
1 Day Post-Op Robotic R Colectomy Subjective: Having some nausea and vomiting, associated with dizziness.  Tolerating some liquids  Objective: Vital signs in last 24 hours: Temp:  [97.4 F (36.3 C)-98.1 F (36.7 C)] 97.4 F (36.3 C) (02/14 1031) Pulse Rate:  [54-72] 59 (02/14 1031) Resp:  [8-21] 18 (02/14 1031) BP: (103-138)/(45-77) 109/56 (02/14 1031) SpO2:  [85 %-100 %] 97 % (02/14 1031)   Intake/Output from previous day: 02/13 0701 - 02/14 0700 In: 1614.8 [P.O.:300; I.V.:1114.8; IV Piggyback:200] Out: 915 [Urine:900; Blood:15] Intake/Output this shift: Total I/O In: 0  Out: 550 [Urine:550]   General appearance: alert and cooperative GI: soft, non-distended  Incision: no significant drainage  Lab Results:  Recent Labs    09/17/23 0452  WBC 9.1  HGB 12.0  HCT 37.4  PLT 204   BMET Recent Labs    09/17/23 0452  NA 133*  K 4.0  CL 103  CO2 22  GLUCOSE 132*  BUN 7*  CREATININE 0.60  CALCIUM 8.7*   PT/INR No results for input(s): "LABPROT", "INR" in the last 72 hours. ABG No results for input(s): "PHART", "HCO3" in the last 72 hours.  Invalid input(s): "PCO2", "PO2"  MEDS, Scheduled  acetaminophen  1,000 mg Oral Q6H   alvimopan  12 mg Oral BID   enoxaparin (LOVENOX) injection  40 mg Subcutaneous Q24H   estrogen (conjugated)-medroxyprogesterone  1 tablet Oral q AM   famotidine  40 mg Oral BID   feeding supplement  237 mL Oral BID BM   gabapentin  300 mg Oral BID   rosuvastatin  10 mg Oral QHS   saccharomyces boulardii  250 mg Oral BID    Studies/Results: No results found.  Assessment: s/p Procedure(s): XI ROBOT ASSISTED LAPAROSCOPIC RIGHT PARTIAL COLECTOMY Patient Active Problem List   Diagnosis Date Noted   Colon cancer, ascending (HCC) 09/16/2023   Adenocarcinoma of cecum (HCC) 08/23/2023   Allergic rhinitis due to animal (cat) (dog) hair and dander 12/11/2020   Allergic rhinitis due to pollen 12/11/2020   Chronic allergic  conjunctivitis 12/11/2020   Cough 12/11/2020   Gastro-esophageal reflux disease without esophagitis 12/11/2020   Impingement syndrome of left shoulder region 11/07/2019   Status post corneal transplant 05/01/2011    PONV  Plan: Cont fluids today  Ambulate in hall once dizziness resolves Full liquid diet as tolerated   LOS: 1 day     .Vanita Panda, MD [facility], [place]    09/17/2023 11:10 AM

## 2023-09-17 NOTE — Plan of Care (Signed)

## 2023-09-17 NOTE — Discharge Instructions (Signed)
SURGERY: POST OP INSTRUCTIONS (Surgery for small bowel obstruction, colon resection, etc)   ######################################################################  EAT Gradually transition to a high fiber diet with a fiber supplement over the next few days after discharge  WALK Walk an hour a day.  Control your pain to do that.    CONTROL PAIN Control pain so that you can walk, sleep, tolerate sneezing/coughing, go up/down stairs.  HAVE A BOWEL MOVEMENT DAILY Keep your bowels regular to avoid problems.  OK to try a laxative to override constipation.  OK to use an antidairrheal to slow down diarrhea.  Call if not better after 2 tries  CALL IF YOU HAVE PROBLEMS/CONCERNS Call if you are still struggling despite following these instructions. Call if you have concerns not answered by these instructions  ######################################################################   DIET Follow a light diet the first few days at home.  Start with a bland diet such as soups, liquids, starchy foods, low fat foods, etc.  If you feel full, bloated, or constipated, stay on a ful liquid or pureed/blenderized diet for a few days until you feel better and no longer constipated. Be sure to drink plenty of fluids every day to avoid getting dehydrated (feeling dizzy, not urinating, etc.). Gradually add a fiber supplement to your diet over the next week.  Gradually get back to a regular solid diet.  Avoid fast food or heavy meals the first week as you are more likely to get nauseated. It is expected for your digestive tract to need a few months to get back to normal.  It is common for your bowel movements and stools to be irregular.  You will have occasional bloating and cramping that should eventually fade away.  Until you are eating solid food normally, off all pain medications, and back to regular activities; your bowels will not be normal. Focus on eating a low-fat, high fiber diet the rest of your life  (See Getting to Good Bowel Health, below).  CARE of your INCISION or WOUND  It is good for closed incisions and even open wounds to be washed every day.  Shower every day.  Short baths are fine.  Wash the incisions and wounds clean with soap & water.    You may leave closed incisions open to air if it is dry.   You may cover the incision with clean gauze & replace it after your daily shower for comfort.  STAPLES: You have skin staples.  Leave them in place & set up an appointment for them to be removed by a surgery office nurse ~10 days after surgery. = 1st week of January 2024    ACTIVITIES as tolerated Start light daily activities --- self-care, walking, climbing stairs-- beginning the day after surgery.  Gradually increase activities as tolerated.  Control your pain to be active.  Stop when you are tired.  Ideally, walk several times a day, eventually an hour a day.   Most people are back to most day-to-day activities in a few weeks.  It takes 4-8 weeks to get back to unrestricted, intense activity. If you can walk 30 minutes without difficulty, it is safe to try more intense activity such as jogging, treadmill, bicycling, low-impact aerobics, swimming, etc. Save the most intensive and strenuous activity for last (Usually 4-8 weeks after surgery) such as sit-ups, heavy lifting, contact sports, etc.  Refrain from any intense heavy lifting or straining until you are off narcotics for pain control.  You will have off days, but things should improve  week-by-week. DO NOT PUSH THROUGH PAIN.  Let pain be your guide: If it hurts to do something, don't do it.  Pain is your body warning you to avoid that activity for another week until the pain goes down. You may drive when you are no longer taking narcotic prescription pain medication, you can comfortably wear a seatbelt, and you can safely make sudden turns/stops to protect yourself without hesitating due to pain. You may have sexual intercourse when it  is comfortable. If it hurts to do something, stop.  MEDICATIONS Take your usually prescribed home medications unless otherwise directed.   Blood thinners:  Usually you can restart any strong blood thinners after the second postoperative day.  It is OK to take aspirin right away.     If you are on strong blood thinners (warfarin/Coumadin, Plavix, Xerelto, Eliquis, Pradaxa, etc), discuss with your surgeon, medicine PCP, and/or cardiologist for instructions on when to restart the blood thinner & if blood monitoring is needed (PT/INR blood check, etc).     PAIN CONTROL Pain after surgery or related to activity is often due to strain/injury to muscle, tendon, nerves and/or incisions.  This pain is usually short-term and will improve in a few months.  To help speed the process of healing and to get back to regular activity more quickly, DO THE FOLLOWING THINGS TOGETHER: Increase activity gradually.  DO NOT PUSH THROUGH PAIN Use Ice and/or Heat Try Gentle Massage and/or Stretching Take over the counter pain medication Take Narcotic prescription pain medication for more severe pain  Good pain control = faster recovery.  It is better to take more medicine to be more active than to stay in bed all day to avoid medications.  Increase activity gradually Avoid heavy lifting at first, then increase to lifting as tolerated over the next 6 weeks. Do not "push through" the pain.  Listen to your body and avoid positions and maneuvers than reproduce the pain.  Wait a few days before trying something more intense Walking an hour a day is encouraged to help your body recover faster and more safely.  Start slowly and stop when getting sore.  If you can walk 30 minutes without stopping or pain, you can try more intense activity (running, jogging, aerobics, cycling, swimming, treadmill, sex, sports, weightlifting, etc.) Remember: If it hurts to do it, then don't do it! Use Ice and/or Heat You will have swelling and  bruising around the incisions.  This will take several weeks to resolve. Ice packs or heating pads (6-8 times a day, 30-60 minutes at a time) will help sooth soreness & bruising. Some people prefer to use ice alone, heat alone, or alternate between ice & heat.  Experiment and see what works best for you.  Consider trying ice for the first few days to help decrease swelling and bruising; then, switch to heat to help relax sore spots and speed recovery. Shower every day.  Short baths are fine.  It feels good!  Keep the incisions and wounds clean with soap & water.   Try Gentle Massage and/or Stretching Massage at the area of pain many times a day Stop if you feel pain - do not overdo it Take over the counter pain medication This helps the muscle and nerve tissues become less irritable and calm down faster Choose ONE of the following over-the-counter anti-inflammatory medications: Acetaminophen 500mg  tabs (Tylenol) 1-2 pills with every meal and just before bedtime (avoid if you have liver problems or if you have  acetaminophen in you narcotic prescription) Naproxen 220mg  tabs (ex. Aleve, Naprosyn) 1-2 pills twice a day (avoid if you have kidney, stomach, IBD, or bleeding problems) Ibuprofen 200mg  tabs (ex. Advil, Motrin) 3-4 pills with every meal and just before bedtime (avoid if you have kidney, stomach, IBD, or bleeding problems) Take with food/snack several times a day as directed for at least 2 weeks to help keep pain / soreness down & more manageable. Take Narcotic prescription pain medication for more severe pain A prescription for strong pain control is often given to you upon discharge (for example: oxycodone/Percocet, hydrocodone/Norco/Vicodin, or tramadol/Ultram) Take your pain medication as prescribed. Be mindful that most narcotic prescriptions contain Tylenol (acetaminophen) as well - avoid taking too much Tylenol. If you are having problems/concerns with the prescription medicine (does  not control pain, nausea, vomiting, rash, itching, etc.), please call us [phone] to see if we need to switch you to a different pain medicine that will work better for you and/or control your side effects better. If you need a refill on your pain medication, you must call the office before 4 pm and on weekdays only.  By federal law, prescriptions for narcotics cannot be called into a pharmacy.  They must be filled out on paper & picked up from our office by the patient or authorized caretaker.  Prescriptions cannot be filled after 4 pm nor on weekends.    WHEN TO CALL us [phone] Severe uncontrolled or worsening pain  Fever over 101 F (38.5 C) Concerns with the incision: Worsening pain, redness, rash/hives, swelling, bleeding, or drainage Reactions / problems with new medications (itching, rash, hives, nausea, etc.) Nausea and/or vomiting Difficulty urinating Difficulty breathing Worsening fatigue, dizziness, lightheadedness, blurred vision Other concerns If you are not getting better after two weeks or are noticing you are getting worse, contact our office [phone] for further advice.  We may need to adjust your medications, re-evaluate you in the office, send you to the emergency room, or see what other things we can do to help. The clinic staff is available to answer your questions during regular business hours (8:30am-5pm).  Please don't hesitate to call and ask to speak to one of our nurses for clinical concerns.    A surgeon from [facility] is always on call at the hospitals 24 hours/day If you have a medical emergency, go to the nearest emergency room or call 911.  FOLLOW UP in our office One the day of your discharge from the hospital (or the next business weekday), please call [facility] to set up or confirm an appointment to see your surgeon in the office for a follow-up appointment.  Usually it is 2-3 weeks after your surgery.   If you  have skin staples at your incision(s), let the office know so we can set up a time in the office for the nurse to remove them (usually around 10 days after surgery). Make sure that you call for appointments the day of discharge (or the next business weekday) from the hospital to ensure a convenient appointment time. IF YOU HAVE DISABILITY OR FAMILY LEAVE FORMS, BRING THEM TO THE OFFICE FOR PROCESSING.  DO NOT GIVE THEM TO YOUR DOCTOR.  [facility] [address], [place], [place]  [address] ? [phone] - Main [phone] - Toll Free,  [phone] - Fax [email or web address]    GETTING TO GOOD BOWEL HEALTH. It is expected for your digestive tract to  to bed.  If gas is a problem, you can purchase  Beano. Sprinkle Beano on the first bite of food before eating to reduce gas. It has no flavor and should not change the taste of your food. You can buy Beano over the counter at your local drugstore.  Foods like fish, onions, garlic, broccoli, asparagus, and cabbage produce odor. Although your pouch is odor-proof, if you eat these foods you may notice a stronger odor when emptying your pouch. If this is a concern, you may want to limit these foods in your diet.  If you have an ileostomy, you will have chronic diarrhea & need to drink more liquids to avoid getting dehydrated.  Consider antidiarrheal medicine like imodium (loperamide) or Lomotil to help slow down bowel movements / diarrhea into your ileostomy bag.  GETTING TO GOOD BOWEL HEALTH WITH AN ILEOSTOMY    With the colon bypassed & not in use, you will have small bowel diarrhea.   It is important to thicken & slow your bowel movements down.   The goal: 4-6 small BOWEL MOVEMENTS A DAY It is important to drink plenty of liquids to avoid getting dehydrated  CONTROLLING ILEOSTOMY DIARRHEA  TAKE A FIBER SUPPLEMENT (FiberCon or Benefiner soluble fiber) twice a day - to thicken stools by absorbing excess fluid and retrain the intestines to act more normally.  Slowly increase the dose over a few weeks.  Too much fiber too soon can backfire and cause cramping & bloating.  TAKE AN IRON SUPPLEMENT twice a day to naturally constipate your bowels.  Usually ferrous sulfate 325mg  twice a day)  TAKE ANTI-DIARRHEAL MEDICINES: Loperamide (Imodium) can slow down diarrhea.  Start with two tablets (= 4mg ) first and then try one tablet every 6 hours.  Can go up to 2 pills four times day (8 pills of 2mg  max) Avoid if you are having fevers or severe pain.  If you are not better or start feeling worse, stop all medicines and call your doctor for advice LoMotil (Diphenoxylate / Atropine) is another medicine that can constipate & slow down bowel moevements Pepto  Bismol (bismuth) can gently thicken bowels as well  If diarrhea is worse,: drink plenty of liquids and try simpler foods for a few days to avoid stressing your intestines further. Avoid dairy products (especially milk & ice cream) for a short time.  The intestines often can lose the ability to digest lactose when stressed. Avoid foods that cause gassiness or bloating.  Typical foods include beans and other legumes, cabbage, broccoli, and dairy foods.  Every person has some sensitivity to other foods, so listen to our body and avoid those foods that trigger problems for you.Call your doctor if you are getting worse or not better.  Sometimes further testing (cultures, endoscopy, X-ray studies, bloodwork, etc) may be needed to help diagnose and treat the cause of the diarrhea. Take extra anti-diarrheal medicines (maximum is 8 pills of 2mg  loperamide a day)   Tips for POUCHING an OSTOMY   Changing Your Pouch The best time to change your pouch is in the morning, before eating or drinking anything. Your stoma can function at any time, but it will function more after eating or drinking.   Applying the pouching system  Place all your equipment close at hand before removing your pouch.  Wash your hands.  Stand or sit in front of a mirror. Use the position that works best for you. Remember that you must keep the skin around the stoma  wrinkle-free for a good seal.  Gently remove the used pouch (1-piece system) or the pouch and old wafer (2-piece system). Empty the pouch into the toilet. Save the closure clip to use again.  Wash the stoma itself and the skin around the stoma. Your stoma may bleed a little when being washed. This is normal. Rinse and pat dry. You may use a wash cloth or soft paper towels (like Bounty), mild soap (like Dial, Safeguard, or [place]), and water. Avoid soaps that contain perfumes or lotions.  For a new pouch (1-piece system) or a new wafer (2-piece system), measure your  stoma using the stoma guide in each box of supplies.  Trace the shape of your stoma onto the back of the new pouch or the back of the new wafer. Cut out the opening. Remove the paper backing and set it aside.  Optional: Apply a skin barrier powder to surrounding skin if it is irritated (bare or weeping), and dust off the excess. Optional: Apply a skin-prep wipe (such as Skin Prep or All-Kare) to the skin around the stoma, and let it dry. Do not apply this solution if the skin is irritated (red, tender, or broken) or if you have shaved around the stoma. Optional: Apply a skin barrier paste (such as Stomahesive, Coloplast, or Premium) around the opening cut in the back of the pouch or wafer. Allow it to dry for 30 to 60 seconds.  Hold the pouch (1-piece system) or wafer (2-piece system) with the sticky side toward your body. Make sure the skin around the stoma is wrinkle-free. Center the opening on the stoma, then press firmly to your abdomen (Fig. 4). Look in the mirror to check if you are placing the pouch, or wafer, in the right position. For a 2-piece system, snap the pouch onto the wafer. Make sure it snaps into place securely.  Place your hand over the stoma and the pouch or wafer for about 30 seconds. The heat from your hand can help the pouch or wafer stick to your skin.  Add deodorant (such as Super Banish or Nullo) to your pouch. Other options include food extracts such as vanilla oil and peppermint extract. Add about 10 drops of the deodorant to the pouch. Then apply the closure clamp. Note: Do not use toxic  chemicals or commercial cleaning agents in your pouch. These substances may harm the stoma.  Optional: For extra seal, apply tape to all 4 sides around the pouch or wafer, as if you were framing a picture. You may use any brand of medical adhesive tape. Change your pouch every 5 to 7 days. Change it immediately if a leak occurs.  Wash your hands afterwards.  If you are wearing a  2-piece system, you may use 2 new pouches per week and alternate them. Rinse the pouch with mild soap and warm water and hang it to dry for the next day. Apply the fresh pouch. Alternate the 2 pouches like this for a week. After a week, change the wafer and begin with 2 new pouches. Place the old pouches in a plastic bag, and put them in the trash.   LIVING WITH AN OSTOMY  Emptying Your Pouch Empty your pouch when it is one-third full (of urine, stool, and/or gas). If you wait until your pouch is fuller than this, it will be more difficult to empty and more noticeable. When you empty your pouch, either put toilet paper in the toilet bowl first, or flush the  you experience problems with a certain food, wait a couple of weeks and try it again.  Will there be odor and noises? Pouching systems are designed to be odor-proof or odor-resistant. There are deodorants that can be used in the pouch. Medications are also available to help reduce odor. Limit gas-producing foods and carbonated beverages. You will experience less gas and fewer noises as you heal from surgery.  How much time will it take to care for my ostomy? At first, you may  spend a lot of time learning about your ostomy and how to take care of it. As you become more comfortable and skilled at changing the pouching system, it will take very little time to care for it.   Will I be able to return to work? People with ostomies can perform most jobs. As soon as you have healed from surgery, you should be able to return to work. Heavy lifting (more than 10 pounds) may be discouraged.   What about intimacy? Sexual relationships and intimacy are important and fulfilling aspects of your life. They should continue after ostomy surgery. Intimacy-related concerns should be discussed openly between you and your partner.   Can I wear regular clothing? You do not need to wear special clothing. Ostomy pouches are fairly flat and barely noticeable. Elastic undergarments will not hurt the stoma or prevent the ostomy from functioning.   Can I participate in sports? An ostomy should not limit your involvement in sports. Many people with ostomies are runners, skiers, swimmers or participate in other active lifestyles. Talk with your caregiver first before doing heavy physical activity.  Will you starve?  Not if you follow doctor's orders at each stage of your post-op adjustment. There is no such thing as an "ostomy diet". Some people with an ostomy will be able to eat and tolerate anything; others may find diffi-culty with some foods. Each person is an individual and must determine, by trial, what is best for them. A good practice for all is to drink plenty of water.   Will you be a social outcast?  Have you met anyone who has an ostomy and is a social outcast? Why should you be the first? Only your attitude and self image will effect how you are treated. No confi-dent person is an Investment banker, corporate.    PROFESSIONAL HELP   Resources are available if you need help or have questions about your ostomy.   Specially trained nurses called Wound, Ostomy Continence Nurses (WOCN) are available for  consultation in most major medical centers.  Consider getting an ostomy consult at an outpatient ostomy clinic.   [place] has an Ostomy Clinic run by an Chartered certified accountant at the [facility].  [phone]. [facility] can help set up an appointment   The [employer] ([employer]OA) is a group made up of many local chapters throughout the [place]. These local groups hold meetings and provide support to prospective and existing ostomates. They sponsor educational events and have qualified visitors to make personal or telephone visits. Contact the UOA for the chapter nearest you and for other educational publications.  More detailed information can be found in Colostomy Guide, a publication of the [employer] (UOA). Contact UOA at 1-(364)350-3258 or visit their web site at YellowSpecialist.at. The website contains links to other sites, suppliers and resources.  Media planner Start Services: Start at the website to enlist for support.  Your Wound Ostomy (WOCN) nurse may have started this  process. [email or web address] Secure Start services are designed to support people as they live their lives with an ostomy or neurogenic bladder. Enrolling is easy and at no cost to the patient. We realize that each person's needs and life journey are different. Through Secure Start services, we want to help people live their life, their way.  #######################################################

## 2023-09-18 LAB — CBC
HCT: 36.9 % (ref 36.0–46.0)
Hemoglobin: 11.9 g/dL — ABNORMAL LOW (ref 12.0–15.0)
MCH: 30.8 pg (ref 26.0–34.0)
MCHC: 32.2 g/dL (ref 30.0–36.0)
MCV: 95.6 fL (ref 80.0–100.0)
Platelets: 216 10*3/uL (ref 150–400)
RBC: 3.86 MIL/uL — ABNORMAL LOW (ref 3.87–5.11)
RDW: 12.8 % (ref 11.5–15.5)
WBC: 6.4 10*3/uL (ref 4.0–10.5)
nRBC: 0 % (ref 0.0–0.2)

## 2023-09-18 LAB — BASIC METABOLIC PANEL
Anion gap: 6 (ref 5–15)
BUN: 6 mg/dL — ABNORMAL LOW (ref 8–23)
CO2: 24 mmol/L (ref 22–32)
Calcium: 8.8 mg/dL — ABNORMAL LOW (ref 8.9–10.3)
Chloride: 108 mmol/L (ref 98–111)
Creatinine, Ser: 0.53 mg/dL (ref 0.44–1.00)
GFR, Estimated: >60 mL/min (ref >60)
Glucose, Bld: 87 mg/dL (ref 70–99)
Potassium: 4.3 mmol/L (ref 3.5–5.1)
Sodium: 138 mmol/L (ref 135–145)

## 2023-09-18 NOTE — Progress Notes (Signed)
2 Days Post-Op   Subjective/Chief Complaint: Nausea improved Tolerating full liquids Flatus/ small liquid BM last night Minimal pain medicine   Objective: Vital signs in last 24 hours: Temp:  [97.4 F (36.3 C)-98.3 F (36.8 C)] 98.3 F (36.8 C) (02/15 0458) Pulse Rate:  [56-72] 68 (02/15 0458) Resp:  [14-18] 14 (02/15 0458) BP: (105-122)/(50-65) 118/65 (02/15 0458) SpO2:  [95 %-99 %] 98 % (02/15 0458) Weight:  [67.4 kg] 67.4 kg (02/15 0500)    Intake/Output from previous day: 02/14 0701 - 02/15 0700 In: 2796.8 [P.O.:980; I.V.:1816.8] Out: 1500 [Urine:1500] Intake/Output this shift: No intake/output data recorded.  General appearance: alert and cooperative GI: soft, non-distended Incision: no significant drainage  Lab Results:  Recent Labs    09/17/23 0452 09/18/23 0523  WBC 9.1 6.4  HGB 12.0 11.9*  HCT 37.4 36.9  PLT 204 216   BMET Recent Labs    09/17/23 0452 09/18/23 0523  NA 133* 138  K 4.0 4.3  CL 103 108  CO2 22 24  GLUCOSE 132* 87  BUN 7* 6*  CREATININE 0.60 0.53  CALCIUM 8.7* 8.8*     Anti-infectives: Anti-infectives (From admission, onward)    Start     Dose/Rate Route Frequency Ordered Stop   09/17/23 0200  cefoTEtan (CEFOTAN) 2 g in sodium chloride 0.9 % 100 mL IVPB        2 g 200 mL/hr over 30 Minutes Intravenous Every 12 hours 09/16/23 1742 09/17/23 0258   09/16/23 1115  cefoTEtan (CEFOTAN) 2 g in sodium chloride 0.9 % 100 mL IVPB        2 g 200 mL/hr over 30 Minutes Intravenous On call to O.R. 09/16/23 1102 09/16/23 1405       Assessment/Plan: s/p Procedure(s): XI ROBOT ASSISTED LAPAROSCOPIC RIGHT PARTIAL COLECTOMY (N/A) 09/16/23 - Dr. Maisie Fus  Advance diet Encourage ambulation Hopefully home Sunday - will remove dressing prior to discharge.     LOS: 2 days    Wynona Luna 09/18/2023

## 2023-09-18 NOTE — Plan of Care (Signed)

## 2023-09-19 LAB — CBC
HCT: 35.9 % — ABNORMAL LOW (ref 36.0–46.0)
Hemoglobin: 11.6 g/dL — ABNORMAL LOW (ref 12.0–15.0)
MCH: 30.8 pg (ref 26.0–34.0)
MCHC: 32.3 g/dL (ref 30.0–36.0)
MCV: 95.2 fL (ref 80.0–100.0)
Platelets: 213 10*3/uL (ref 150–400)
RBC: 3.77 MIL/uL — ABNORMAL LOW (ref 3.87–5.11)
RDW: 12.7 % (ref 11.5–15.5)
WBC: 6.4 10*3/uL (ref 4.0–10.5)
nRBC: 0 % (ref 0.0–0.2)

## 2023-09-19 LAB — BASIC METABOLIC PANEL
Anion gap: 6 (ref 5–15)
BUN: 12 mg/dL (ref 8–23)
CO2: 27 mmol/L (ref 22–32)
Calcium: 8.8 mg/dL — ABNORMAL LOW (ref 8.9–10.3)
Chloride: 106 mmol/L (ref 98–111)
Creatinine, Ser: 0.65 mg/dL (ref 0.44–1.00)
GFR, Estimated: >60 mL/min (ref >60)
Glucose, Bld: 89 mg/dL (ref 70–99)
Potassium: 4 mmol/L (ref 3.5–5.1)
Sodium: 139 mmol/L (ref 135–145)

## 2023-09-19 MED ORDER — HYDROCODONE-ACETAMINOPHEN 5-325 MG PO TABS: 1.0000 | ORAL_TABLET | Freq: Four times a day (QID) | ORAL | Status: DC | PRN

## 2023-09-19 MED ORDER — ACETAMINOPHEN 500 MG PO TABS
1000.0000 mg | ORAL_TABLET | Freq: Four times a day (QID) | ORAL | Status: DC | PRN
  Administered 2023-09-19: 1000 mg via ORAL
  Filled 2023-09-19 (×2): qty 2

## 2023-09-19 NOTE — Progress Notes (Signed)
3 Days Post-Op   Subjective/Chief Complaint: Patient had a rough night - multiple episodes of diarrhea Hgb stable WBC normal Pain -moderately controlled   Objective: Vital signs in last 24 hours: Temp:  [98 F (36.7 C)-98.8 F (37.1 C)] 98.5 F (36.9 C) (02/16 0530) Pulse Rate:  [68-78] 78 (02/16 0530) Resp:  [18] 18 (02/16 0530) BP: (105-138)/(56-77) 121/77 (02/16 0530) SpO2:  [95 %-100 %] 95 % (02/16 0530) Last BM Date : 09/18/23  Intake/Output from previous day: 02/15 0701 - 02/16 0700 In: 837.8 [P.O.:625; I.V.:212.8] Out: -  Intake/Output this shift: No intake/output data recorded.  General appearance: alert and cooperative GI: soft, non-distended Incision: dressing removed - all incisions c/d/i  Lab Results:  Recent Labs    09/18/23 0523 09/19/23 0437  WBC 6.4 6.4  HGB 11.9* 11.6*  HCT 36.9 35.9*  PLT 216 213   BMET Recent Labs    09/18/23 0523 09/19/23 0437  NA 138 139  K 4.3 4.0  CL 108 106  CO2 24 27  GLUCOSE 87 89  BUN 6* 12  CREATININE 0.53 0.65  CALCIUM 8.8* 8.8*   PT/INR No results for input(s): "LABPROT", "INR" in the last 72 hours. ABG No results for input(s): "PHART", "HCO3" in the last 72 hours.  Invalid input(s): "PCO2", "PO2"  Studies/Results: No results found.  Anti-infectives: Anti-infectives (From admission, onward)    Start     Dose/Rate Route Frequency Ordered Stop   09/17/23 0200  cefoTEtan (CEFOTAN) 2 g in sodium chloride 0.9 % 100 mL IVPB        2 g 200 mL/hr over 30 Minutes Intravenous Every 12 hours 09/16/23 1742 09/17/23 0258   09/16/23 1115  cefoTEtan (CEFOTAN) 2 g in sodium chloride 0.9 % 100 mL IVPB        2 g 200 mL/hr over 30 Minutes Intravenous On call to O.R. 09/16/23 1102 09/16/23 1405       Assessment/Plan: s/p Procedure(s): XI ROBOT ASSISTED LAPAROSCOPIC RIGHT PARTIAL COLECTOMY (N/A) 09/16/23 - Dr. Maisie Fus   Adjust pain regimen Possible discharge in 24 hours  LOS: 3 days    Sherri Morris 09/19/2023

## 2023-09-19 NOTE — Plan of Care (Signed)

## 2023-09-20 LAB — SURGICAL PATHOLOGY

## 2023-09-20 NOTE — Plan of Care (Signed)

## 2023-09-20 NOTE — Discharge Summary (Signed)
Physician Discharge Summary  Patient ID: Sherri Morris MRN: [id number] DOB/AGE: [birth year] 76 y.o.  Admit date: 09/16/2023 Discharge date: 09/20/2023  Admission Diagnoses: Colon cancer  Discharge Diagnoses:  Principal Problem:   Colon cancer, ascending [facility])   Discharged Condition: good  Hospital Course: Patient was admitted to the med surg floor after surgery.  Diet was advanced as tolerated.  Patient began to have bowel function on postop day 2.  By postop day 4, she was tolerating a solid diet and pain was controlled with oral medications.  She was urinating without difficulty and ambulating without assistance.  Patient was felt to be in stable condition for discharge to home.   Consults: None  Significant Diagnostic Studies: labs: cbc, bmet  Treatments: IV hydration, analgesia: acetaminophen, and surgery: robotic right colectomy  Discharge Exam: Blood pressure 119/83, pulse 77, temperature 98.6 F (37 C), temperature source Oral, resp. rate 16, height 5' 6.5" (1.689 m), weight 66.3 kg, SpO2 97%. General appearance: alert and cooperative GI: soft, non-distended Incision/Wound: C/D/I  Disposition: Discharge disposition: 01-Home or Self Care        Allergies as of 09/20/2023       Reactions   Lansoprazole Diarrhea   Pantoprazole Diarrhea        Medication List     TAKE these medications    acetaminophen 500 MG tablet Commonly known as: TYLENOL Take 500-1,000 mg by mouth every 6 (six) hours as needed (pain.).   ascorbic acid 500 MG tablet Commonly known as: VITAMIN C Take 500 mg by mouth daily in the afternoon.   augmented betamethasone dipropionate 0.05 % cream Commonly known as: DIPROLENE-AF Apply 1 Application topically 2 (two) times daily as needed (skin irritation (skin folds--breast area)).   estrogen (conjugated)-medroxyprogesterone 0.3-1.5 MG tablet Commonly known as: PREMPRO Take 1 tablet by mouth in the morning.   famotidine 40 MG  tablet Commonly known as: Pepcid Take 1 tablet (40 mg total) by mouth 2 (two) times daily.   multivitamin with minerals Tabs tablet Take 1 tablet by mouth in the morning.   rosuvastatin 10 MG tablet Commonly known as: CRESTOR Take 10 mg by mouth at bedtime.   traMADol 50 MG tablet Commonly known as: ULTRAM Take 1-2 tablets (50-100 mg total) by mouth every 6 (six) hours as needed for moderate pain (pain score 4-6).        Follow-up Information     Romie Levee, MD. Schedule an appointment as soon as possible for a visit in 2 week(s).   Specialties: General Surgery, Colon and Rectal Surgery Contact information: [address] [place] [address] [phone]                 Signed: Vanita Panda 09/20/2023, 8:27 AM

## 2023-09-20 NOTE — Progress Notes (Signed)
AVS reviewed w/patient & daughter who verbalized an understanding- no other questions at this tim e- pt dressed for d/c to home- to lobby via w/c

## 2023-10-03 ENCOUNTER — Other Ambulatory Visit: Payer: Self-pay | Admitting: Internal Medicine

## 2023-10-25 DIAGNOSIS — M25519 Pain in unspecified shoulder: Secondary | ICD-10-CM | POA: Diagnosis not present

## 2023-10-26 ENCOUNTER — Ambulatory Visit: Payer: Self-pay | Admitting: Podiatry

## 2023-10-26 ENCOUNTER — Encounter: Payer: Self-pay | Admitting: Podiatry

## 2023-10-26 DIAGNOSIS — M79675 Pain in left toe(s): Secondary | ICD-10-CM

## 2023-10-26 DIAGNOSIS — M79674 Pain in right toe(s): Secondary | ICD-10-CM | POA: Diagnosis not present

## 2023-10-26 DIAGNOSIS — B351 Tinea unguium: Secondary | ICD-10-CM

## 2023-10-26 NOTE — Patient Instructions (Signed)

## 2023-10-26 NOTE — Progress Notes (Unsigned)
 Willl cal for lamisil after fance

## 2023-11-02 ENCOUNTER — Encounter: Payer: Self-pay | Admitting: Internal Medicine

## 2023-11-02 ENCOUNTER — Ambulatory Visit: Payer: Medicare PPO | Admitting: Internal Medicine

## 2023-11-02 VITALS — BP 118/68 | HR 66 | Ht 66.0 in | Wt 144.0 lb

## 2023-11-02 DIAGNOSIS — Z85038 Personal history of other malignant neoplasm of large intestine: Secondary | ICD-10-CM

## 2023-11-02 DIAGNOSIS — Z9049 Acquired absence of other specified parts of digestive tract: Secondary | ICD-10-CM

## 2023-11-02 DIAGNOSIS — B351 Tinea unguium: Secondary | ICD-10-CM | POA: Diagnosis not present

## 2023-11-02 DIAGNOSIS — F17210 Nicotine dependence, cigarettes, uncomplicated: Secondary | ICD-10-CM

## 2023-11-02 DIAGNOSIS — R195 Other fecal abnormalities: Secondary | ICD-10-CM

## 2023-11-02 DIAGNOSIS — K219 Gastro-esophageal reflux disease without esophagitis: Secondary | ICD-10-CM | POA: Diagnosis not present

## 2023-11-02 DIAGNOSIS — C180 Malignant neoplasm of cecum: Secondary | ICD-10-CM

## 2023-11-02 DIAGNOSIS — K2100 Gastro-esophageal reflux disease with esophagitis, without bleeding: Secondary | ICD-10-CM

## 2023-11-02 MED ORDER — RABEPRAZOLE SODIUM 20 MG PO TBEC: 20.0000 mg | DELAYED_RELEASE_TABLET | Freq: Every day | ORAL | 3 refills | Status: AC

## 2023-11-02 NOTE — Patient Instructions (Addendum)
 We have sent the following medications to your pharmacy for you to pick up at your convenience: Aciphex.   You can still take famotidine as a back up for break through reflux symptoms.   Continue your current diet and continue loperamide.   You will be due for a recall colonoscopy in 08/2024. We will send you a reminder in the mail when it gets closer to that time.  _______________________________________________________  If your blood pressure at your visit was 140/90 or greater, please contact your primary care physician to follow up on this.  _______________________________________________________  If you are age 76 or older, your body mass index should be between 23-30. Your Body mass index is 23.24 kg/m. If this is out of the aforementioned range listed, please consider follow up with your Primary Care Provider.  If you are age 32 or younger, your body mass index should be between 19-25. Your Body mass index is 23.24 kg/m. If this is out of the aformentioned range listed, please consider follow up with your Primary Care Provider.   ________________________________________________________  The [facility] providers would like to encourage you to use [facility] to communicate with providers for non-urgent requests or questions.  Due to long hold times on the telephone, sending your provider a message by [facility] may be a faster and more efficient way to get a response.  Please allow 48 business hours for a response.  Please remember that this is for non-urgent requests.  _______________________________________________________

## 2023-11-03 ENCOUNTER — Encounter: Payer: Self-pay | Admitting: Internal Medicine

## 2023-11-03 NOTE — Progress Notes (Signed)
 Subjective:    Patient ID: Sherri Morris, female    DOB: September 01, 1945, 78 y.o.   MRN: [id number]  HPI Sherri Morris is a 78 year old female with a history of colon cancer, GERD with esophagitis, hyperlipidemia, history of melanoma and vitamin D deficiency who presents for follow-up.  She is here alone today.   She has experienced changes in her bowel habits following a right hemicolectomy, which included the removal of the ileocecal valve. Her bowel movements have become more regular, resembling her pre-surgery pattern, with a morning bowel movement. Stools are becoming more formed, but she remains concerned about the unpredictability of her bowel movements, which has previously limited her social activities. She is cautious about her bowel habits in social settings but reports improvement over the last four to five days. She currently takes Imodium as needed for diarrhea and inquires about its prophylactic use for an upcoming trip. She also uses Pepto-Bismol occasionally and does not use Metamucil or Benefiber, as she consumes a fiber-rich diet.  She experiences acid reflux and describes a significant episode of acid regurgitation, which she refers to as 'major.' She is currently taking famotidine 40 mg twice daily but reports some breakthrough symptoms, including acid regurgitation and a cough. She recalls being previously prescribed pantoprazole, which she discontinued due to diarrhea. Her cough was previously attributed to acid reflux rather than post-nasal drip.  She discusses a toenail fungus and mentions a previous conversation with another doctor about the potential gastrointestinal side effects of terbinafine (Lamisil). She expresses concern about starting this medication due to her current gastrointestinal issues.  She is preparing for a trip to [place] in June and is focused on managing her symptoms effectively before traveling. She is a retired Editor, commissioning and has a family, including a  son named Sherri Morris who recently achieved [employer] status. No current abdominal pain.   Review of Systems As per HPI, otherwise negative  Current Medications, Allergies, Past Medical History, Past Surgical History, Family History and Social History were reviewed in [employer] record.    Objective:   Physical Exam BP 118/68   Pulse 66   Ht 5\' 6"  (1.676 m)   Wt 144 lb (65.3 kg)   BMI 23.24 kg/m  Gen: awake, alert, NAD HEENT: anicteric  Ext: no c/c/e Neuro: nonfocal     Latest Ref Rng & Units 09/19/2023    4:37 AM 09/18/2023    5:23 AM 09/17/2023    4:52 AM  CBC  WBC 4.0 - 10.5 K/uL 6.4  6.4  9.1   Hemoglobin 12.0 - 15.0 g/dL 40.9  81.1  91.4   Hematocrit 36.0 - 46.0 % 35.9  36.9  37.4   Platelets 150 - 400 K/uL 213  216  204    CMP     Component Value Date/Time   NA 139 09/19/2023 0437   K 4.0 09/19/2023 0437   CL 106 09/19/2023 0437   CO2 27 09/19/2023 0437   GLUCOSE 89 09/19/2023 0437   BUN 12 09/19/2023 0437   CREATININE 0.65 09/19/2023 0437   CALCIUM 8.8 (L) 09/19/2023 0437   PROT 6.8 08/18/2023 1049   ALBUMIN 4.3 08/18/2023 1049   AST 22 08/18/2023 1049   ALT 14 08/18/2023 1049   ALKPHOS 62 08/18/2023 1049   BILITOT 0.5 08/18/2023 1049   GFR 83.39 08/18/2023 1049   GFRNONAA >60 09/19/2023 0437         Assessment & Plan:   Post-hemicolectomy  bowel changes Following hemicolectomy with ileocecal valve removal, she experiences rapid small bowel transit leading to diarrhea and unpredictable bowel movements. Bowel habits have improved with more formed stools, indicating colonic adaptation. Bile acid sequestrants were discussed but she prefers to wait for further adaptation. Loperamide and Pepto-Bismol were discussed for managing diarrhea, especially during travel. - Monitor bowel habits and consider bile acid sequestrants if symptoms persist. - Advise on prophylactic loperamide for travel-related diarrhea. - Discuss safe use of  Pepto-Bismol for diarrhea management.  Colon cancer (Stage 1) Jan 2025 s/p right hemicolectomy She underwent hemicolectomy for colon cancer. Regular surveillance colonoscopies are crucial for monitoring recurrence or new polyps. She prefers late morning colonoscopies to avoid early preparation. - Schedule surveillance colonoscopy for January 2026.  Gastroesophageal reflux disease (GERD) She reports acid reflux and regurgitation despite famotidine 40 mg BID. GERD is suspected, and Aciphex (rabeprazole) 20 mg daily is suggested as an alternative. Famotidine can be used for breakthrough symptoms. Monitor for adverse effects such as diarrhea. - Prescribe Aciphex 20 mg daily in the morning. - Continue famotidine as needed for breakthrough symptoms. - Monitor for adverse effects, particularly diarrhea, and adjust treatment if necessary.  Toenail fungus She has toenail fungus and is considering terbinafine (Lamisil). Terbinafine may elevate liver enzymes; monitor liver function before and during treatment. She is concerned about gastrointestinal side effects but was reassured terbinafine is unlikely to cause significant issues. Liver cysts noted on previous imaging are not of immediate concern. - Monitor liver function tests before and during terbinafine treatment if initiated.  30 minutes total spent today including patient facing time, coordination of care, reviewing medical history/procedures/pertinent radiology studies, and documentation of the encounter.

## 2023-11-10 DIAGNOSIS — M25519 Pain in unspecified shoulder: Secondary | ICD-10-CM | POA: Diagnosis not present

## 2023-11-16 DIAGNOSIS — M25562 Pain in left knee: Secondary | ICD-10-CM | POA: Diagnosis not present

## 2023-11-16 DIAGNOSIS — M25561 Pain in right knee: Secondary | ICD-10-CM | POA: Diagnosis not present

## 2024-02-29 DIAGNOSIS — Q828 Other specified congenital malformations of skin: Secondary | ICD-10-CM | POA: Diagnosis not present

## 2024-02-29 DIAGNOSIS — Z85828 Personal history of other malignant neoplasm of skin: Secondary | ICD-10-CM | POA: Diagnosis not present

## 2024-02-29 DIAGNOSIS — L814 Other melanin hyperpigmentation: Secondary | ICD-10-CM | POA: Diagnosis not present

## 2024-02-29 DIAGNOSIS — L570 Actinic keratosis: Secondary | ICD-10-CM | POA: Diagnosis not present

## 2024-02-29 DIAGNOSIS — D485 Neoplasm of uncertain behavior of skin: Secondary | ICD-10-CM | POA: Diagnosis not present

## 2024-02-29 DIAGNOSIS — D692 Other nonthrombocytopenic purpura: Secondary | ICD-10-CM | POA: Diagnosis not present

## 2024-02-29 DIAGNOSIS — C44619 Basal cell carcinoma of skin of left upper limb, including shoulder: Secondary | ICD-10-CM | POA: Diagnosis not present

## 2024-02-29 DIAGNOSIS — Z85820 Personal history of malignant melanoma of skin: Secondary | ICD-10-CM | POA: Diagnosis not present

## 2024-02-29 DIAGNOSIS — L821 Other seborrheic keratosis: Secondary | ICD-10-CM | POA: Diagnosis not present

## 2024-03-20 DIAGNOSIS — M25519 Pain in unspecified shoulder: Secondary | ICD-10-CM | POA: Diagnosis not present

## 2024-04-04 DIAGNOSIS — M25561 Pain in right knee: Secondary | ICD-10-CM | POA: Diagnosis not present

## 2024-04-04 DIAGNOSIS — M25562 Pain in left knee: Secondary | ICD-10-CM | POA: Diagnosis not present

## 2024-04-06 DIAGNOSIS — M25519 Pain in unspecified shoulder: Secondary | ICD-10-CM | POA: Diagnosis not present

## 2024-04-17 DIAGNOSIS — M25561 Pain in right knee: Secondary | ICD-10-CM | POA: Diagnosis not present

## 2024-04-22 DIAGNOSIS — M25561 Pain in right knee: Secondary | ICD-10-CM | POA: Diagnosis not present

## 2024-04-25 DIAGNOSIS — M25561 Pain in right knee: Secondary | ICD-10-CM | POA: Diagnosis not present

## 2024-04-26 DIAGNOSIS — M170 Bilateral primary osteoarthritis of knee: Secondary | ICD-10-CM | POA: Diagnosis not present

## 2024-05-05 ENCOUNTER — Telehealth: Payer: Self-pay | Admitting: Internal Medicine

## 2024-05-05 DIAGNOSIS — Z1231 Encounter for screening mammogram for malignant neoplasm of breast: Secondary | ICD-10-CM | POA: Diagnosis not present

## 2024-05-05 NOTE — Telephone Encounter (Signed)
 Inbound call from patient requesting to know if she is able to wait until March 2026 for colonoscopy. States she has knee surgery in January. Patient is requesting a call to be advise on time frame. Please advise, thank you

## 2024-05-05 NOTE — Telephone Encounter (Signed)
 Dr Albertus, please advise  Patient with history of cecal adenocarcinoma diagnosed 08/11/23. She is to have repeat 1 year colonoscopy 08/2024 but has knee surgery in January. Wants to know if ok to hold off on repeat colonoscopy until March 2026, or should she have possibly in December before knee surgery.

## 2024-05-06 DIAGNOSIS — Z23 Encounter for immunization: Secondary | ICD-10-CM | POA: Diagnosis not present

## 2024-05-08 NOTE — Telephone Encounter (Signed)
 Left message advising patient that she may wait until March 2026 to have colonoscopy after she has recovered from knee replacement. She is to call us  back with any questions in the meantime.

## 2024-05-08 NOTE — Telephone Encounter (Signed)
 Ok to hold off on colonoscopy until March 2026 (when she is recovered from knee surgery) Thanks JMP

## 2024-05-18 DIAGNOSIS — M25519 Pain in unspecified shoulder: Secondary | ICD-10-CM | POA: Diagnosis not present

## 2024-06-19 DIAGNOSIS — Z1212 Encounter for screening for malignant neoplasm of rectum: Secondary | ICD-10-CM | POA: Diagnosis not present

## 2024-06-19 DIAGNOSIS — E785 Hyperlipidemia, unspecified: Secondary | ICD-10-CM | POA: Diagnosis not present

## 2024-06-23 DIAGNOSIS — M25519 Pain in unspecified shoulder: Secondary | ICD-10-CM | POA: Diagnosis not present

## 2024-06-26 DIAGNOSIS — Z0000 Encounter for general adult medical examination without abnormal findings: Secondary | ICD-10-CM | POA: Diagnosis not present

## 2024-06-26 DIAGNOSIS — R82998 Other abnormal findings in urine: Secondary | ICD-10-CM | POA: Diagnosis not present

## 2024-06-26 DIAGNOSIS — Z79899 Other long term (current) drug therapy: Secondary | ICD-10-CM | POA: Diagnosis not present

## 2024-06-26 DIAGNOSIS — C44601 Unspecified malignant neoplasm of skin of unspecified upper limb, including shoulder: Secondary | ICD-10-CM | POA: Diagnosis not present

## 2024-06-26 DIAGNOSIS — C439 Malignant melanoma of skin, unspecified: Secondary | ICD-10-CM | POA: Diagnosis not present

## 2024-06-26 DIAGNOSIS — E785 Hyperlipidemia, unspecified: Secondary | ICD-10-CM | POA: Diagnosis not present

## 2024-06-26 DIAGNOSIS — M8580 Other specified disorders of bone density and structure, unspecified site: Secondary | ICD-10-CM | POA: Diagnosis not present

## 2024-06-26 DIAGNOSIS — I2510 Atherosclerotic heart disease of native coronary artery without angina pectoris: Secondary | ICD-10-CM | POA: Diagnosis not present

## 2024-06-26 DIAGNOSIS — F329 Major depressive disorder, single episode, unspecified: Secondary | ICD-10-CM | POA: Diagnosis not present

## 2024-06-26 DIAGNOSIS — K219 Gastro-esophageal reflux disease without esophagitis: Secondary | ICD-10-CM | POA: Diagnosis not present

## 2024-07-04 DIAGNOSIS — M25519 Pain in unspecified shoulder: Secondary | ICD-10-CM | POA: Diagnosis not present

## 2024-07-06 DIAGNOSIS — M25519 Pain in unspecified shoulder: Secondary | ICD-10-CM | POA: Diagnosis not present

## 2024-08-04 NOTE — Progress Notes (Addendum)
 Anesthesia Review:  PCP: Eloy Neth  Cardiologist : none   PPM/ ICD: Device Orders: Rep Notified:  Chest x-ray : 08/22/23- CT Chest  EKG : 09/06/23  and 08/08/24  Echo : 08/22/22  CT Card-2022  Stress test: 09/04/22  Cardiac Cath :   Activity level: can do a flight of stairs without difficulty  Sleep Study/ CPAP : none  Fasting Blood Sugar :      / Checks Blood Sugar -- times a day:    Blood Thinner/ Instructions /Last Dose: ASA / Instructions/ Last Dose :    Difficulty hearing

## 2024-08-07 NOTE — Patient Instructions (Signed)
 SURGICAL WAITING ROOM VISITATION  Patients having surgery or a procedure may have no more than 2 support people in the waiting area - these visitors may rotate.    Children ages 33 and under will not be able to visit patients in [facility] under most circumstances.   Visitors with respiratory illnesses are discouraged from visiting and should remain at home.  If the patient needs to stay at the hospital during part of their recovery, the visitor guidelines for inpatient rooms apply. Pre-op nurse will coordinate an appropriate time for 1 support person to accompany patient in pre-op.  This support person may not rotate.    Please refer to the [facility] website for the visitor guidelines for Inpatients (after your surgery is over and you are in a regular room).       Your procedure is scheduled on:  08/15/2024    Report to [facility] Main Entrance    Report to admitting at   [phone]   Call this number if you have problems the morning of surgery [phone]   Do not eat food :After Midnight.   After Midnight you may have the following liquids until _ 0415_____ AM DAY OF SURGERY  Water Non-Citrus Juices (without pulp, NO RED-Apple, White grape, White cranberry) Black Coffee (NO MILK/CREAM OR CREAMERS, sugar ok)  Clear Tea (NO MILK/CREAM OR CREAMERS, sugar ok) regular and decaf                             Plain Jell-O (NO RED)                                           Fruit ices (not with fruit pulp, NO RED)                                     Popsicles (NO RED)                                                               Sports drinks like Gatorade (NO RED)                   The day of surgery:  Drink ONE (1) Pre-Surgery Clear Ensure or G2 at 0415 AM the morning of surgery. Drink in one sitting. Do not sip.  This drink was given to you during your hospital  pre-op appointment visit. Nothing else to drink after completing the  Pre-Surgery Clear Ensure or  G2.          If you have questions, please contact your surgeons office.       Oral Hygiene is also important to reduce your risk of infection.                                    Remember - BRUSH YOUR TEETH THE MORNING OF SURGERY WITH YOUR REGULAR TOOTHPASTE  DENTURES WILL BE REMOVED PRIOR TO SURGERY PLEASE DO NOT APPLY Poly grip  OR ADHESIVES!!!   Do NOT smoke after Midnight   Stop all vitamins and herbal supplements 7 days before surgery.   Take these medicines the morning of surgery with A SIP OF WATER:  aciphex    DO NOT TAKE ANY ORAL DIABETIC MEDICATIONS DAY OF YOUR SURGERY  Bring CPAP mask and tubing day of surgery.                              You may not have any metal on your body including hair pins, jewelry, and body piercing             Do not wear make-up, lotions, powders, perfumes/cologne, or deodorant  Do not wear nail polish including gel and S&S, artificial/acrylic nails, or any other type of covering on natural nails including finger and toenails. If you have artificial nails, gel coating, etc. that needs to be removed by a nail salon please have this removed prior to surgery or surgery may need to be canceled/ delayed if the surgeon/ anesthesia feels like they are unable to be safely monitored.   Do not shave  48 hours prior to surgery.               Men may shave face and neck.   Do not bring valuables to the hospital. [facility] IS NOT             RESPONSIBLE   FOR VALUABLES.   Contacts, glasses, dentures or bridgework may not be worn into surgery.   Bring small overnight bag day of surgery.   DO NOT BRING YOUR HOME MEDICATIONS TO THE HOSPITAL. PHARMACY WILL DISPENSE MEDICATIONS LISTED ON YOUR MEDICATION LIST TO YOU DURING YOUR ADMISSION IN THE HOSPITAL!    Patients discharged on the day of surgery will not be allowed to drive home.  Someone NEEDS to stay with you for the first 24 hours after anesthesia.   Special Instructions: Bring a copy of your  healthcare power of attorney and living will documents the day of surgery if you haven't scanned them before.              Please read over the following fact sheets you were given: IF YOU HAVE QUESTIONS ABOUT YOUR PRE-OP INSTRUCTIONS PLEASE CALL [phone].   If you received a COVID test during your pre-op visit  it is requested that you wear a mask when out in public, stay away from anyone that may not be feeling well and notify your surgeon if you develop symptoms. If you test positive for Covid or have been in contact with anyone that has tested positive in the last 10 days please notify you surgeon.      Pre-operative 4 CHG Bath Instructions   You can play a key role in reducing the risk of infection after surgery. Your skin needs to be as free of germs as possible. You can reduce the number of germs on your skin by washing with CHG (chlorhexidine  gluconate) soap before surgery. CHG is an antiseptic soap that kills germs and continues to kill germs even after washing.   DO NOT use if you have an allergy to chlorhexidine /CHG or antibacterial soaps. If your skin becomes reddened or irritated, stop using the CHG and notify one of our RNs at [phone].   Please shower with the CHG soap starting 4 days before surgery using the following schedule:     Please keep in mind the  following:  DO NOT shave, including legs and underarms, starting the day of your first shower.   You may shave your face at any point before/day of surgery.  Place clean sheets on your bed the day you start using CHG soap. Use a clean washcloth (not used since being washed) for each shower. DO NOT sleep with pets once you start using the CHG.   CHG Shower Instructions:  If you choose to wash your hair and private area, wash first with your normal shampoo/soap.  After you use shampoo/soap, rinse your hair and body thoroughly to remove shampoo/soap residue.  Turn the water OFF and apply about 3 tablespoons (45 ml) of  CHG soap to a CLEAN washcloth.  Apply CHG soap ONLY FROM YOUR NECK DOWN TO YOUR TOES (washing for 3-5 minutes)  DO NOT use CHG soap on face, private areas, open wounds, or sores.  Pay special attention to the area where your surgery is being performed.  If you are having back surgery, having someone wash your back for you may be helpful. Wait 2 minutes after CHG soap is applied, then you may rinse off the CHG soap.  Pat dry with a clean towel  Put on clean clothes/pajamas   If you choose to wear lotion, please use ONLY the CHG-compatible lotions on the back of this paper.     Additional instructions for the day of surgery: DO NOT APPLY any lotions, deodorants, cologne, or perfumes.   Put on clean/comfortable clothes.  Brush your teeth.  Ask your nurse before applying any prescription medications to the skin.      CHG Compatible Lotions   Aveeno Moisturizing lotion  Cetaphil Moisturizing Cream  Cetaphil Moisturizing Lotion  Clairol Herbal Essence Moisturizing Lotion, Dry Skin  Clairol Herbal Essence Moisturizing Lotion, Extra Dry Skin  Clairol Herbal Essence Moisturizing Lotion, Normal Skin  Curel Age Defying Therapeutic Moisturizing Lotion with Alpha Hydroxy  Curel Extreme Care Body Lotion  Curel Soothing Hands Moisturizing Hand Lotion  Curel Therapeutic Moisturizing Cream, Fragrance-Free  Curel Therapeutic Moisturizing Lotion, Fragrance-Free  Curel Therapeutic Moisturizing Lotion, Original Formula  Eucerin Daily Replenishing Lotion  Eucerin Dry Skin Therapy Plus Alpha Hydroxy Crme  Eucerin Dry Skin Therapy Plus Alpha Hydroxy Lotion  Eucerin Original Crme  Eucerin Original Lotion  Eucerin Plus Crme Eucerin Plus Lotion  Eucerin TriLipid Replenishing Lotion  Keri Anti-Bacterial Hand Lotion  Keri Deep Conditioning Original Lotion Dry Skin Formula Softly Scented  Keri Deep Conditioning Original Lotion, Fragrance Free Sensitive Skin Formula  Keri Lotion Fast Absorbing  Fragrance Free Sensitive Skin Formula  Keri Lotion Fast Absorbing Softly Scented Dry Skin Formula  Keri Original Lotion  Keri Skin Renewal Lotion Keri Silky Smooth Lotion  Keri Silky Smooth Sensitive Skin Lotion  Nivea Body Creamy Conditioning Oil  Nivea Body Extra Enriched Teacher, Adult Education Moisturizing Lotion Nivea Crme  Nivea Skin Firming Lotion  NutraDerm 30 Skin Lotion  NutraDerm Skin Lotion  NutraDerm Therapeutic Skin Cream  NutraDerm Therapeutic Skin Lotion  ProShield Protective Hand Cream  Provon moisturizing lotion

## 2024-08-08 ENCOUNTER — Other Ambulatory Visit: Payer: Self-pay

## 2024-08-08 ENCOUNTER — Encounter (HOSPITAL_COMMUNITY)
Admission: RE | Admit: 2024-08-08 | Discharge: 2024-08-08 | Disposition: A | Discharge status: Home / Self Care | Source: Ambulatory Visit | Attending: Orthopedic Surgery | Admitting: Orthopedic Surgery

## 2024-08-08 ENCOUNTER — Encounter (HOSPITAL_COMMUNITY): Payer: Self-pay

## 2024-08-08 VITALS — BP 137/73 | HR 62 | Temp 98.5°F | Resp 16 | Ht 67.0 in | Wt 140.0 lb

## 2024-08-08 DIAGNOSIS — Z01818 Encounter for other preprocedural examination: Secondary | ICD-10-CM | POA: Insufficient documentation

## 2024-08-08 DIAGNOSIS — M1711 Unilateral primary osteoarthritis, right knee: Secondary | ICD-10-CM | POA: Diagnosis not present

## 2024-08-08 DIAGNOSIS — K219 Gastro-esophageal reflux disease without esophagitis: Secondary | ICD-10-CM | POA: Insufficient documentation

## 2024-08-08 DIAGNOSIS — Z87891 Personal history of nicotine dependence: Secondary | ICD-10-CM | POA: Diagnosis not present

## 2024-08-08 LAB — CBC
HCT: 40.7 % (ref 36.0–46.0)
Hemoglobin: 13.7 g/dL (ref 12.0–15.0)
MCH: 31.9 pg (ref 26.0–34.0)
MCHC: 33.7 g/dL (ref 30.0–36.0)
MCV: 94.7 fL (ref 80.0–100.0)
Platelets: 251 K/uL (ref 150–400)
RBC: 4.3 MIL/uL (ref 3.87–5.11)
RDW: 12.6 % (ref 11.5–15.5)
WBC: 6 K/uL (ref 4.0–10.5)
nRBC: 0 % (ref 0.0–0.2)

## 2024-08-08 LAB — SURGICAL PCR SCREEN
MRSA, PCR: NEGATIVE
Staphylococcus aureus: NEGATIVE

## 2024-08-09 ENCOUNTER — Encounter (HOSPITAL_COMMUNITY): Payer: Self-pay

## 2024-08-09 NOTE — Progress Notes (Signed)
"   Case: [id number] Date/Time: 08/15/24 0700   Procedure: ARTHROPLASTY, KNEE, TOTAL (Right: Knee)   Anesthesia type: Spinal   Diagnosis: Primary osteoarthritis of right knee [M17.11]   Pre-op diagnosis: Right knee osteoarthritis   Location: [facility] ROOM 09 / [facility]   Surgeons: Ernie Cough, MD       DISCUSSION: Sherri Morris is a 79 year old female with past medical history of former smoking, CAD (by imaging), GERD, colon cancer s/p colectomy (09/2023), arthritis.  Patient previously seen by cardiology in 08/2022 for preop clearance prior to a colonoscopy due to reports of chest pain.  Stress testing and echo were ordered both which came back normal.  At PAT visit patient reports she can do a flight of stairs without difficulty.  Anticipate she can proceed  VS: BP 137/73   Pulse 62   Temp 36.9 C (Oral)   Resp 16   Ht 5' 7 (1.702 m)   Wt 63.5 kg   SpO2 100%   BMI 21.93 kg/m   PROVIDERS: Shayne Anes, MD   LABS: Labs reviewed: Acceptable for surgery.  BMP not drawn in preop.  Obtain day of surgery (all labs ordered are listed, but only abnormal results are displayed)  Labs Reviewed  SURGICAL PCR SCREEN  CBC     EKG 08/08/2024:  Normal sinus rhythm Low voltage QRS  Regadenoson  (with Mod Bruce protocol) Nuclear stress test 09/01/2022  Myocardial perfusion is normal. Significant breast tissue attenuation artifact is present. Overall LV systolic function is normal without regional wall motion abnormalities. Stress LV EF: 63%. Low risk study. Nondiagnostic ECG stress. The heart rate response was consistent with Regadenoson . The blood pressure response was physiologic. No previous exam available for comparison.  Echocardiogram 08/20/2022: Normal LV systolic function with visual EF 60-65%. Left ventricle cavity is normal in size. Normal left ventricular wall thickness. Presence of a septal bulge. Normal global wall motion. No obvious regional wall motion abnormalities. Normal  diastolic filling pattern, normal LAP. Mild (Grade I) aortic regurgitation. Pericardium is normal. Trace pericardial effusion, located anteriorly of right ventricle. There is no hemodynamic significance. No prior study for comparison.  Past Medical History:  Diagnosis Date   Allergy    enviromental   Arthritis    Cancer (HCC)    skin    Cataract    GERD (gastroesophageal reflux disease)    Hyperlipidemia    Osteopenia    Shingles    Skin cancer    Skin melanoma (HCC)    Vitamin D insufficiency     Past Surgical History:  Procedure Laterality Date   CHOLECYSTECTOMY     colon resection surgery      CORNEAL TRANSPLANT Right 08/03/2010   CORNEAL TRANSPLANT Left 06/2020   FOOT FRACTURE SURGERY Left 08/04/2011   MELANOMA EXCISION  08/04/2011   has multiple squamous cells and melanoma excisions   ROTATOR CUFF REPAIR Left 12/2019   TONSILLECTOMY AND ADENOIDECTOMY      MEDICATIONS:  acetaminophen  (TYLENOL ) 500 MG tablet   estrogen, conjugated,-medroxyprogesterone (PREMPRO ) 0.3-1.5 MG per tablet   ibuprofen (ADVIL) 200 MG tablet   Multiple Vitamin (MULTIVITAMIN WITH MINERALS) TABS tablet   RABEprazole  (ACIPHEX ) 20 MG tablet   rosuvastatin  (CRESTOR ) 10 MG tablet   vitamin C (ASCORBIC ACID) 500 MG tablet   No current facility-administered medications for this encounter.    Burnard CHRISTELLA Odis DEVONNA [facility] Short Stay/Anesthesiology [facility] Phone [phone] 08/09/2024 11:30 AM       "

## 2024-08-09 NOTE — Anesthesia Preprocedure Evaluation (Addendum)
"                                    Anesthesia Evaluation  Patient identified by MRN, date of birth, ID band Patient awake    Reviewed: Allergy & Precautions, NPO status , Patient's Chart, lab work & pertinent test results  History of Anesthesia Complications Negative for: history of anesthetic complications  Airway Mallampati: I  TM Distance: >3 FB Neck ROM: Full    Dental  (+) Dental Advisory Given   Pulmonary former smoker   breath sounds clear to auscultation       Cardiovascular (-) angina + CAD (non-obstructive)   Rhythm:Regular Rate:Normal  '24 stress: Myocardial perfusion is normal. Significant breast tissue attenuation artifact is present. Overall LV systolic function is normal without regional wall motion abnormalities. Stress LV EF: 63%. Low risk study.  '24 ECHO: Normal LV systolic function with visual EF 60-65%. Left ventricle cavity  is normal in size. Normal left ventricular wall thickness. Presence of a  septal bulge. Normal global wall motion. No obvious regional wall motion  abnormalities. Normal diastolic filling pattern, normal LAP.  Mild (Grade I) aortic regurgitation.  Pericardium is normal. Trace pericardial effusion, located anteriorly of  right ventricle.     Neuro/Psych negative neurological ROS     GI/Hepatic Neg liver ROS,GERD  Medicated and Controlled,,  Endo/Other  negative endocrine ROS    Renal/GU negative Renal ROS     Musculoskeletal   Abdominal   Peds  Hematology Hb 13.7, plt 251k   Anesthesia Other Findings   Reproductive/Obstetrics                              Anesthesia Physical Anesthesia Plan  ASA: 2  Anesthesia Plan: Spinal   Post-op Pain Management: Regional block* and Tylenol  PO (pre-op)*   Induction:   PONV Risk Score and Plan: 2 and Treatment may vary due to age or medical condition  Airway Management Planned: Natural Airway and Simple Face Mask  Additional  Equipment: None  Intra-op Plan:   Post-operative Plan:   Informed Consent: I have reviewed the patients History and Physical, chart, labs and discussed the procedure including the risks, benefits and alternatives for the proposed anesthesia with the patient or authorized representative who has indicated his/her understanding and acceptance.     Dental advisory given  Plan Discussed with: CRNA and Surgeon  Anesthesia Plan Comments: (See PAT note from 1/6 Plan routine monitors, SAB with adductor canal block for post op analgesia)         Anesthesia Quick Evaluation  "

## 2024-08-14 ENCOUNTER — Telehealth: Payer: Self-pay | Admitting: Internal Medicine

## 2024-08-14 NOTE — Telephone Encounter (Signed)
 Inbound call from patient stating she would like to know how long after her knee replacement she can schedule her procedure she's worried about the anesthesia so close to previous procedure. Requesting a call back  Please advise  Thank you

## 2024-08-14 NOTE — Telephone Encounter (Signed)
 Patient having knee arthroplasty tomorrow, 08/15/24. We previously advised she may have colonoscopy in March 2026 which will give her time to recover from knee surgery. Patient has scheduled 10/23/24 colonoscopy and 10/10/24 Mychart Previsit.

## 2024-08-15 ENCOUNTER — Observation Stay (HOSPITAL_COMMUNITY)
Admission: RE | Admit: 2024-08-15 | Discharge: 2024-08-16 | Disposition: A | Discharge status: Home / Self Care | Source: Ambulatory Visit | Attending: Orthopedic Surgery | Admitting: Orthopedic Surgery

## 2024-08-15 ENCOUNTER — Encounter (HOSPITAL_COMMUNITY): Payer: Self-pay | Admitting: Orthopedic Surgery

## 2024-08-15 ENCOUNTER — Other Ambulatory Visit: Payer: Self-pay

## 2024-08-15 ENCOUNTER — Encounter (HOSPITAL_COMMUNITY): Admission: RE | Payer: Self-pay | Source: Ambulatory Visit

## 2024-08-15 ENCOUNTER — Ambulatory Visit (HOSPITAL_COMMUNITY): Payer: Self-pay | Admitting: Certified Registered"

## 2024-08-15 ENCOUNTER — Ambulatory Visit (HOSPITAL_COMMUNITY): Payer: Self-pay | Admitting: Medical

## 2024-08-15 DIAGNOSIS — I2510 Atherosclerotic heart disease of native coronary artery without angina pectoris: Secondary | ICD-10-CM | POA: Insufficient documentation

## 2024-08-15 DIAGNOSIS — M1711 Unilateral primary osteoarthritis, right knee: Principal | ICD-10-CM | POA: Insufficient documentation

## 2024-08-15 DIAGNOSIS — Z01818 Encounter for other preprocedural examination: Secondary | ICD-10-CM

## 2024-08-15 DIAGNOSIS — M25561 Pain in right knee: Secondary | ICD-10-CM | POA: Diagnosis present

## 2024-08-15 DIAGNOSIS — Z85828 Personal history of other malignant neoplasm of skin: Secondary | ICD-10-CM | POA: Insufficient documentation

## 2024-08-15 DIAGNOSIS — Z87891 Personal history of nicotine dependence: Secondary | ICD-10-CM | POA: Insufficient documentation

## 2024-08-15 DIAGNOSIS — Z96651 Presence of right artificial knee joint: Principal | ICD-10-CM

## 2024-08-15 HISTORY — PX: TOTAL KNEE ARTHROPLASTY: SHX125

## 2024-08-15 LAB — BASIC METABOLIC PANEL WITH GFR
Anion gap: 10 (ref 5–15)
BUN: 13 mg/dL (ref 8–23)
CO2: 26 mmol/L (ref 22–32)
Calcium: 9.6 mg/dL (ref 8.9–10.3)
Chloride: 103 mmol/L (ref 98–111)
Creatinine, Ser: 0.73 mg/dL (ref 0.44–1.00)
GFR, Estimated: >60 mL/min
Glucose, Bld: 118 mg/dL — ABNORMAL HIGH (ref 70–99)
Potassium: 3.6 mmol/L (ref 3.5–5.1)
Sodium: 138 mmol/L (ref 135–145)

## 2024-08-15 MED ORDER — OXYCODONE HCL 5 MG/5ML PO SOLN: 5.0000 mg | Freq: Once | ORAL | Status: AC | PRN

## 2024-08-15 MED ORDER — ORAL CARE MOUTH RINSE: 15.0000 mL | OROMUCOSAL | Status: DC | PRN

## 2024-08-15 MED ORDER — LACTATED RINGERS IV SOLN: INTRAVENOUS | Status: DC

## 2024-08-15 MED ORDER — METHOCARBAMOL 1000 MG/10ML IJ SOLN: 500.0000 mg | Freq: Four times a day (QID) | INTRAMUSCULAR | Status: DC | PRN

## 2024-08-15 MED ORDER — ACETAMINOPHEN 500 MG PO TABS
1000.0000 mg | ORAL_TABLET | Freq: Once | ORAL | Status: AC
  Administered 2024-08-15: 1000 mg via ORAL
  Filled 2024-08-15: qty 2

## 2024-08-15 MED ORDER — HYDROMORPHONE HCL 1 MG/ML IJ SOLN
0.2500 mg | INTRAMUSCULAR | Status: DC | PRN
  Administered 2024-08-15 (×4): 0.5 mg via INTRAVENOUS

## 2024-08-15 MED ORDER — PANTOPRAZOLE SODIUM 40 MG PO TBEC: 40.0000 mg | DELAYED_RELEASE_TABLET | Freq: Every day | ORAL | Status: DC

## 2024-08-15 MED ORDER — FENTANYL CITRATE (PF) 100 MCG/2ML IJ SOLN
INTRAMUSCULAR | Status: DC | PRN
  Administered 2024-08-15 (×4): 50 ug via INTRAVENOUS

## 2024-08-15 MED ORDER — FENTANYL CITRATE (PF) 100 MCG/2ML IJ SOLN
INTRAMUSCULAR | Status: AC
  Filled 2024-08-15: qty 2

## 2024-08-15 MED ORDER — OXYCODONE HCL 5 MG PO TABS
5.0000 mg | ORAL_TABLET | ORAL | Status: DC | PRN
  Administered 2024-08-15 – 2024-08-16 (×4): 5 mg via ORAL
  Filled 2024-08-15 (×4): qty 1

## 2024-08-15 MED ORDER — CEFAZOLIN SODIUM-DEXTROSE 2-4 GM/100ML-% IV SOLN
2.0000 g | Freq: Four times a day (QID) | INTRAVENOUS | Status: AC
  Administered 2024-08-15 (×2): 2 g via INTRAVENOUS
  Filled 2024-08-15 (×2): qty 100

## 2024-08-15 MED ORDER — POVIDONE-IODINE 10 % EX SWAB: 2.0000 | Freq: Once | CUTANEOUS | Status: DC

## 2024-08-15 MED ORDER — 0.9 % SODIUM CHLORIDE (POUR BTL) OPTIME
TOPICAL | Status: DC | PRN
  Administered 2024-08-15: 1000 mL

## 2024-08-15 MED ORDER — BUPIVACAINE-EPINEPHRINE (PF) 0.25% -1:200000 IJ SOLN
INTRAMUSCULAR | Status: AC
  Filled 2024-08-15: qty 30

## 2024-08-15 MED ORDER — HYDROMORPHONE HCL 1 MG/ML IJ SOLN
INTRAMUSCULAR | Status: AC
  Filled 2024-08-15: qty 2

## 2024-08-15 MED ORDER — METOCLOPRAMIDE HCL 5 MG/ML IJ SOLN
5.0000 mg | Freq: Three times a day (TID) | INTRAMUSCULAR | Status: DC | PRN
  Administered 2024-08-15: 10 mg via INTRAVENOUS
  Filled 2024-08-15: qty 2

## 2024-08-15 MED ORDER — EPHEDRINE SULFATE-NACL 50-0.9 MG/10ML-% IV SOSY
PREFILLED_SYRINGE | INTRAVENOUS | Status: DC | PRN
  Administered 2024-08-15: 5 mg via INTRAVENOUS

## 2024-08-15 MED ORDER — DIPHENHYDRAMINE HCL 12.5 MG/5ML PO ELIX: 12.5000 mg | ORAL_SOLUTION | ORAL | Status: DC | PRN

## 2024-08-15 MED ORDER — MENTHOL 3 MG MT LOZG: 1.0000 | LOZENGE | OROMUCOSAL | Status: DC | PRN

## 2024-08-15 MED ORDER — ORAL CARE MOUTH RINSE: 15.0000 mL | Freq: Once | OROMUCOSAL | Status: AC

## 2024-08-15 MED ORDER — METHOCARBAMOL 500 MG PO TABS
ORAL_TABLET | ORAL | Status: AC
  Filled 2024-08-15: qty 1

## 2024-08-15 MED ORDER — CHLORHEXIDINE GLUCONATE 0.12 % MT SOLN
15.0000 mL | Freq: Once | OROMUCOSAL | Status: AC
  Administered 2024-08-15: 15 mL via OROMUCOSAL

## 2024-08-15 MED ORDER — ROPIVACAINE HCL 7.5 MG/ML IJ SOLN
INTRAMUSCULAR | Status: DC | PRN
  Administered 2024-08-15: 20 mL via PERINEURAL

## 2024-08-15 MED ORDER — DEXAMETHASONE SOD PHOSPHATE PF 10 MG/ML IJ SOLN
8.0000 mg | Freq: Once | INTRAMUSCULAR | Status: AC
  Administered 2024-08-15: 8 mg via INTRAVENOUS

## 2024-08-15 MED ORDER — SODIUM CHLORIDE 0.9 % IV SOLN: INTRAVENOUS | Status: DC

## 2024-08-15 MED ORDER — DEXAMETHASONE SOD PHOSPHATE PF 10 MG/ML IJ SOLN
INTRAMUSCULAR | Status: AC
  Filled 2024-08-15: qty 1

## 2024-08-15 MED ORDER — ACETAMINOPHEN 500 MG PO TABS
1000.0000 mg | ORAL_TABLET | Freq: Four times a day (QID) | ORAL | Status: DC
  Administered 2024-08-15 – 2024-08-16 (×3): 1000 mg via ORAL
  Filled 2024-08-15 (×2): qty 2

## 2024-08-15 MED ORDER — BISACODYL 10 MG RE SUPP: 10.0000 mg | Freq: Every day | RECTAL | Status: DC | PRN

## 2024-08-15 MED ORDER — ALUM & MAG HYDROXIDE-SIMETH 200-200-20 MG/5ML PO SUSP: 30.0000 mL | ORAL | Status: DC | PRN

## 2024-08-15 MED ORDER — DEXAMETHASONE SOD PHOSPHATE PF 10 MG/ML IJ SOLN
10.0000 mg | Freq: Once | INTRAMUSCULAR | Status: AC
  Administered 2024-08-16: 10 mg via INTRAVENOUS
  Filled 2024-08-15: qty 1

## 2024-08-15 MED ORDER — ONDANSETRON HCL 4 MG PO TABS
4.0000 mg | ORAL_TABLET | Freq: Four times a day (QID) | ORAL | Status: DC | PRN
  Administered 2024-08-16: 4 mg via ORAL
  Filled 2024-08-15: qty 1

## 2024-08-15 MED ORDER — PROPOFOL 10 MG/ML IV BOLUS
INTRAVENOUS | Status: AC
  Filled 2024-08-15: qty 20

## 2024-08-15 MED ORDER — POLYETHYLENE GLYCOL 3350 17 G PO PACK
17.0000 g | PACK | Freq: Two times a day (BID) | ORAL | Status: DC
  Administered 2024-08-15 – 2024-08-16 (×2): 17 g via ORAL
  Filled 2024-08-15 (×2): qty 1

## 2024-08-15 MED ORDER — PROPOFOL 10 MG/ML IV BOLUS
INTRAVENOUS | Status: DC | PRN
  Administered 2024-08-15: 100 mg via INTRAVENOUS
  Administered 2024-08-15: 50 mg via INTRAVENOUS
  Administered 2024-08-15: 30 mg via INTRAVENOUS
  Administered 2024-08-15: 20 mg via INTRAVENOUS

## 2024-08-15 MED ORDER — ONDANSETRON HCL 4 MG/2ML IJ SOLN
INTRAMUSCULAR | Status: AC
  Filled 2024-08-15: qty 2

## 2024-08-15 MED ORDER — ONDANSETRON HCL 4 MG/2ML IJ SOLN
INTRAMUSCULAR | Status: DC | PRN
  Administered 2024-08-15: 4 mg via INTRAVENOUS

## 2024-08-15 MED ORDER — PHENOL 1.4 % MT LIQD: 1.0000 | OROMUCOSAL | Status: DC | PRN

## 2024-08-15 MED ORDER — TRANEXAMIC ACID-NACL 1000-0.7 MG/100ML-% IV SOLN
1000.0000 mg | INTRAVENOUS | Status: AC
  Administered 2024-08-15: 1000 mg via INTRAVENOUS
  Filled 2024-08-15: qty 100

## 2024-08-15 MED ORDER — KETOROLAC TROMETHAMINE 30 MG/ML IJ SOLN
INTRAMUSCULAR | Status: AC
  Filled 2024-08-15: qty 1

## 2024-08-15 MED ORDER — SODIUM CHLORIDE (PF) 0.9 % IJ SOLN
INTRAMUSCULAR | Status: DC | PRN
  Administered 2024-08-15: 61 mL

## 2024-08-15 MED ORDER — MEPERIDINE HCL 25 MG/ML IJ SOLN: 6.2500 mg | INTRAMUSCULAR | Status: DC | PRN

## 2024-08-15 MED ORDER — TRANEXAMIC ACID-NACL 1000-0.7 MG/100ML-% IV SOLN
1000.0000 mg | Freq: Once | INTRAVENOUS | Status: AC
  Administered 2024-08-15: 1000 mg via INTRAVENOUS
  Filled 2024-08-15: qty 100

## 2024-08-15 MED ORDER — SODIUM CHLORIDE 0.9 % IR SOLN
Status: DC | PRN
  Administered 2024-08-15: 1000 mL

## 2024-08-15 MED ORDER — HYDROMORPHONE HCL 1 MG/ML IJ SOLN: 0.5000 mg | INTRAMUSCULAR | Status: DC | PRN

## 2024-08-15 MED ORDER — SENNA 8.6 MG PO TABS
2.0000 | ORAL_TABLET | Freq: Every day | ORAL | Status: DC
  Administered 2024-08-15: 17.2 mg via ORAL
  Filled 2024-08-15: qty 2

## 2024-08-15 MED ORDER — OXYCODONE HCL 5 MG PO TABS
5.0000 mg | ORAL_TABLET | Freq: Once | ORAL | Status: AC | PRN
  Administered 2024-08-15: 5 mg via ORAL

## 2024-08-15 MED ORDER — METHOCARBAMOL 500 MG PO TABS
500.0000 mg | ORAL_TABLET | Freq: Four times a day (QID) | ORAL | Status: DC | PRN
  Administered 2024-08-15 – 2024-08-16 (×3): 500 mg via ORAL
  Filled 2024-08-15 (×2): qty 1

## 2024-08-15 MED ORDER — CEFAZOLIN SODIUM-DEXTROSE 2-4 GM/100ML-% IV SOLN
2.0000 g | INTRAVENOUS | Status: AC
  Administered 2024-08-15: 2 g via INTRAVENOUS
  Filled 2024-08-15: qty 100

## 2024-08-15 MED ORDER — PROPOFOL 1000 MG/100ML IV EMUL
INTRAVENOUS | Status: AC
  Filled 2024-08-15: qty 100

## 2024-08-15 MED ORDER — OMEPRAZOLE 20 MG PO CPDR
20.0000 mg | DELAYED_RELEASE_CAPSULE | Freq: Every day | ORAL | Status: DC
  Administered 2024-08-16: 20 mg via ORAL
  Filled 2024-08-15: qty 1

## 2024-08-15 MED ORDER — SODIUM CHLORIDE (PF) 0.9 % IJ SOLN
INTRAMUSCULAR | Status: AC
  Filled 2024-08-15: qty 30

## 2024-08-15 MED ORDER — PHENYLEPHRINE HCL-NACL 20-0.9 MG/250ML-% IV SOLN
INTRAVENOUS | Status: DC | PRN
  Administered 2024-08-15: 20 ug/min via INTRAVENOUS

## 2024-08-15 MED ORDER — STERILE WATER FOR IRRIGATION IR SOLN
Status: DC | PRN
  Administered 2024-08-15: 2000 mL

## 2024-08-15 MED ORDER — LIDOCAINE HCL (CARDIAC) PF 100 MG/5ML IV SOSY
PREFILLED_SYRINGE | INTRAVENOUS | Status: DC | PRN
  Administered 2024-08-15: 40 mg via INTRAVENOUS

## 2024-08-15 MED ORDER — MIDAZOLAM HCL (PF) 2 MG/2ML IJ SOLN: 0.5000 mg | Freq: Once | INTRAMUSCULAR | Status: DC | PRN

## 2024-08-15 MED ORDER — ONDANSETRON HCL 4 MG/2ML IJ SOLN
4.0000 mg | Freq: Four times a day (QID) | INTRAMUSCULAR | Status: DC | PRN
  Administered 2024-08-15: 4 mg via INTRAVENOUS
  Filled 2024-08-15: qty 2

## 2024-08-15 MED ORDER — ROSUVASTATIN CALCIUM 10 MG PO TABS
10.0000 mg | ORAL_TABLET | Freq: Every day | ORAL | Status: DC
  Administered 2024-08-15: 10 mg via ORAL
  Filled 2024-08-15: qty 1

## 2024-08-15 MED ORDER — LIDOCAINE HCL (PF) 2 % IJ SOLN
INTRAMUSCULAR | Status: AC
  Filled 2024-08-15: qty 5

## 2024-08-15 MED ORDER — CELECOXIB 200 MG PO CAPS
200.0000 mg | ORAL_CAPSULE | Freq: Every day | ORAL | Status: DC
  Administered 2024-08-16: 200 mg via ORAL
  Filled 2024-08-15: qty 1

## 2024-08-15 MED ORDER — PROPOFOL 500 MG/50ML IV EMUL
INTRAVENOUS | Status: DC | PRN
  Administered 2024-08-15: 40 ug/kg/min via INTRAVENOUS

## 2024-08-15 MED ORDER — OXYCODONE HCL 5 MG PO TABS
ORAL_TABLET | ORAL | Status: AC
  Filled 2024-08-15: qty 1

## 2024-08-15 MED ORDER — OXYCODONE HCL 5 MG PO TABS: 10.0000 mg | ORAL_TABLET | ORAL | Status: DC | PRN

## 2024-08-15 MED ORDER — ASPIRIN 81 MG PO CHEW
81.0000 mg | CHEWABLE_TABLET | Freq: Two times a day (BID) | ORAL | Status: DC
  Administered 2024-08-15 – 2024-08-16 (×2): 81 mg via ORAL
  Filled 2024-08-15 (×3): qty 1

## 2024-08-15 MED ORDER — MEPIVACAINE HCL (PF) 2 % IJ SOLN
INTRAMUSCULAR | Status: DC | PRN
  Administered 2024-08-15: 60 mg via INTRATHECAL

## 2024-08-15 MED ORDER — METOCLOPRAMIDE HCL 5 MG PO TABS: 5.0000 mg | ORAL_TABLET | Freq: Three times a day (TID) | ORAL | Status: DC | PRN

## 2024-08-15 MED ORDER — EPHEDRINE 5 MG/ML INJ
INTRAVENOUS | Status: AC
  Filled 2024-08-15: qty 5

## 2024-08-15 NOTE — Anesthesia Procedure Notes (Signed)
 Anesthesia Regional Block: Adductor canal block   Pre-Anesthetic Checklist: , timeout performed,  Correct Patient, Correct Site, Correct Laterality,  Correct Procedure, Correct Position, site marked,  Risks and benefits discussed,  Surgical consent,  Pre-op evaluation,  At surgeon's request and post-op pain management  Laterality: Right and Lower  Prep: chloraprep       Needles:  Injection technique: Single-shot  Needle Type: Echogenic Needle     Needle Length: 9cm  Needle Gauge: 21     Additional Needles:   Procedures:,,,, ultrasound used (permanent image in chart),,    Narrative:  Start time: 08/15/2024 6:53 AM End time: 08/15/2024 6:59 AM Injection made incrementally with aspirations every 5 mL.  Performed by: Personally  Anesthesiologist: Leonce Athens, MD  Additional Notes: Pt identified in Holding room.  Monitors applied. Working IV access confirmed. Timeout, Sterile prep R thigh.  #21ga ECHOgenic Arrow block needle into adductor canal with US  guidance.  20cc 0.75% Ropivacaine  injected incrementally after negative test dose.  Patient asymptomatic, VSS, no heme aspirated, tolerated well.   JAYSON Leonce, MD

## 2024-08-15 NOTE — Evaluation (Signed)
 Physical Therapy Evaluation Patient Details Name: Sherri Morris MRN: [id number] DOB: 07/08/1946 Today's Date: 08/15/2024  History of Present Illness  79 yo female s/p RTKA 08/15/24. PMH:CAD,      gerd, L RCR, L foot fracture  Clinical Impression  Pt admitted with above diagnosis.  Pt currently with functional limitations due to the deficits listed below (see PT Problem List). Pt will benefit from acute skilled PT to increase their independence and safety with mobility to allow discharge.     The patient appear anxious, reporting nausea. Has been medicated  for nausea. Patient vomited upon sitting up onto bed edge and reports feeling much better. Patient able to stand and take a few steps using the RW. Patient plans to Dc to son's home initially with 2 STE. Patient has a RW.       If plan is discharge home, recommend the following: A little help with walking and/or transfers;A little help with bathing/dressing/bathroom;Help with stairs or ramp for entrance   Can travel by private vehicle        Equipment Recommendations None recommended by PT  Recommendations for Other Services       Functional Status Assessment Patient has had a recent decline in their functional status and demonstrates the ability to make significant improvements in function in a reasonable and predictable amount of time.     Precautions / Restrictions Precautions Precautions: Fall;Knee Recall of Precautions/Restrictions: Intact Restrictions Weight Bearing Restrictions Per Provider Order: No      Mobility  Bed Mobility Overal bed mobility: Needs Assistance Bed Mobility: Supine to Sit     Supine to sit: Contact guard     General bed mobility comments: cues for safety, slow down    Transfers Overall transfer level: Needs assistance Equipment used: Rolling walker (2 wheels) Transfers: Sit to/from Stand Sit to Stand: Min assist           General transfer comment: cues for hand and right leg  position    Ambulation/Gait Ambulation/Gait assistance: Min assist Gait Distance (Feet): 8 Feet Assistive device: Rolling walker (2 wheels) Gait Pattern/deviations: Step-to pattern Gait velocity: decr     General Gait Details: cues for safety, sides stepped along bed stepped forward x 4 then turned and stepped x 4' more  Stairs            Wheelchair Mobility     Tilt Bed    Modified Rankin (Stroke Patients Only)       Balance Overall balance assessment: Mild deficits observed, not formally tested                                           Pertinent Vitals/Pain Pain Assessment Pain Assessment: 0-10 Pain Score: 3  Pain Location: right knee Pain Descriptors / Indicators: Discomfort Pain Intervention(s): Premedicated before session, Monitored during session, Ice applied    Home Living Family/patient expects to be discharged to:: Private residence Living Arrangements: Spouse/significant other Available Help at Discharge: Family Type of Home: House Home Access: Stairs to enter Entrance Stairs-Rails: None Entrance Stairs-Number of Steps: 2   Home Layout: One level Home Equipment: [employer] (2 wheels);BSC/3in1 Additional Comments: going to son's house first, herhome is 2 level    Prior Function Prior Level of Function : Independent/Modified Independent;Driving  Extremity/Trunk Assessment   Upper Extremity Assessment Upper Extremity Assessment: Overall WFL for tasks assessed    Lower Extremity Assessment Lower Extremity Assessment: RLE deficits/detail RLE Deficits / Details: SLR with slight lag. 10-65*    Cervical / Trunk Assessment Cervical / Trunk Assessment: Normal  Communication   Communication Communication: No apparent difficulties    Cognition Arousal: Alert Behavior During Therapy: WFL for tasks assessed/performed, Anxious, Restless, Impulsive   PT - Cognitive impairments: No apparent  impairments                       PT - Cognition Comments: pt. dealing with nausea , once vomitted much calmer. Following commands: Intact       Cueing Cueing Techniques: Verbal cues     General Comments      Exercises Total Joint Exercises Ankle Circles/Pumps: AROM, Both, 10 reps Quad Sets: AROM, Right, 5 reps   Assessment/Plan    PT Assessment Patient needs continued PT services  PT Problem List Decreased strength;Decreased range of motion;Decreased activity tolerance;Decreased mobility;Decreased safety awareness;Pain       PT Treatment Interventions DME instruction;Gait training;Functional mobility training;Stair training;Therapeutic activities;Therapeutic exercise;Patient/family education    PT Goals (Current goals can be found in the Care Plan section)  Acute Rehab PT Goals Patient Stated Goal: go home PT Goal Formulation: With patient/family Time For Goal Achievement: 08/22/24 Potential to Achieve Goals: Good    Frequency 7X/week     Co-evaluation               AM-PAC PT 6 Clicks Mobility  Outcome Measure Help needed turning from your back to your side while in a flat bed without using bedrails?: A Little Help needed moving from lying on your back to sitting on the side of a flat bed without using bedrails?: A Little Help needed moving to and from a bed to a chair (including a wheelchair)?: A Little Help needed standing up from a chair using your arms (e.g., wheelchair or bedside chair)?: A Little Help needed to walk in hospital room?: A Little Help needed climbing 3-5 steps with a railing? : A Little 6 Click Score: 18    End of Session   Activity Tolerance: Patient tolerated treatment well Patient left: in chair;with call bell/phone within reach;with chair alarm set;with family/visitor present Nurse Communication: Mobility status PT Visit Diagnosis: Unsteadiness on feet (R26.81);Muscle weakness (generalized) (M62.81)    Time:  1620-1650 PT Time Calculation (min) (ACUTE ONLY): 30 min   Charges:   PT Evaluation $PT Eval Low Complexity: 1 Low PT Treatments $Gait Training: 8-22 mins PT General Charges $$ ACUTE PT VISIT: 1 Visit         Darice Potters PT Acute Rehabilitation Services Office [phone]   Potters Darice Norris 08/15/2024, 5:01 PM

## 2024-08-15 NOTE — Op Note (Signed)
 " NAME:  Sherri Morris                      MEDICAL RECORD NO.:  [id number]                             FACILITY:  [facility]      PHYSICIAN:  Donnice BIRCH. Ernie, M.D.  DATE OF BIRTH:  03-13-1946      DATE OF PROCEDURE:  08/15/2024                                     OPERATIVE REPORT         PREOPERATIVE DIAGNOSIS:  right knee osteoarthritis.      POSTOPERATIVE DIAGNOSIS:  right knee osteoarthritis.      FINDINGS:  The patient was noted to have complete loss of cartilage and   bone-on-bone arthritis with associated osteophytes in the lateral and patellofemoral compartments of   the knee with a significant synovitis and associated effusion.  The patient had failed months of conservative treatment including medications, injection therapy, activity modification.     PROCEDURE:  right total knee replacement.      COMPONENTS USED:  DePuy Attune fixed bearing cruciate retaining medial stabilized knee   system, a size 5N femur, 5 tibia, size 7 mm CR MS AOX insert, and 35 anatomic patellar   button.      SURGEON:  Donnice BIRCH. Ernie, M.D.      ASSISTANT:  Rosina Calin, PA-C.      ANESTHESIA:  General, Regional, and Spinal.      SPECIMENS:  None.      COMPLICATION:  None.      DRAINS:  None.  EBL: ,300 cc      TOURNIQUET TIME:  no tourniquet was used     The patient was stable to the recovery room.      INDICATION FOR PROCEDURE:  Sherri Morris is a 79 y.o. female patient of   mine.  The patient had been seen, evaluated, and treated for months conservatively in the   office with medication, activity modification, and injections.  The patient had   radiographic changes of bone-on-bone arthritis with endplate sclerosis and osteophytes noted.  Based on the radiographic changes and failed conservative measures, the patient   decided to proceed with definitive treatment, total knee replacement.  Risks of infection, DVT, component failure, need for revision surgery, neurovascular injury were  reviewed in the office setting.  The postop course was reviewed stressing the efforts to maximize post-operative satisfaction and function.  Consent was obtained for benefit of pain   relief.      PROCEDURE IN DETAIL:  The patient was brought to the operative theater.   Once adequate anesthesia, preoperative antibiotics, 2 gm of Ancef ,1 gm of Tranexamic Acid , and 10 mg of Decadron  administered, the patient was positioned supine with bony prominences padded and protected.  The right lower extremity was prepped and draped in sterile fashion.  A time-   out was performed identifying the patient, planned procedure, and the appropriate extremity.      The right lower extremity was placed in the [facility] leg holder.  A midline incision was   made followed by median parapatellar arthrotomy.  Following initial   exposure, attention was first directed to the patella.  Precut  measurement was noted to be 22 mm.  I resected down to 13 mm and used a   35 anatomic patellar button to restore patellar height as well as cover the cut surface.  A limited lateral partial facetectomy was performed.     The lug holes were drilled and a metal shim was placed to protect the   patella from retractors and saw blade during the procedure.      At this point, attention was now directed to the femur.  The femoral   canal was opened with a drill, irrigated to try to prevent fat emboli.  An   intramedullary rod was passed at 3 degrees valgus, 10 mm of bone was   resected off the distal femur.  Following this resection, the tibia was   subluxated anteriorly.  Using the extramedullary guide, 2 mm of bone was resected off   the proximal lateral tibia.  We confirmed the gap would be   stable medially and laterally with a size 5 spacer block as well as confirmed that the tibial cut was perpendicular in the coronal plane, checking with an alignment rod.      Once this was done, I sized the femur to be a size 5 in the anterior-    posterior dimension, chose a narrow component based on medial and   lateral dimension.  The size 5 rotation block was then pinned in   position anterior referenced using the C-clamp to set rotation.  The   anterior, posterior, and  chamfer cuts were made without difficulty nor   notching making certain that I was along the anterior cortex to help   with flexion gap stability.      The final femoral shim cut was made off the lateral aspect of distal femur.      At this point, the tibia was sized to be a size 5.  The size 5 tray was   then pinned in position through the medial third of the tubercle,   drilled, and keel punched.  Trial reduction was now carried with a 5 femur,  5 tibia, a size 7 mm CR insert, and the 35 anatomic patella botton.  The knee was brought to full extension with good flexion stability with the patella   tracking through the trochlea without application of pressure.  Given   all these findings the trial components removed.  Final components were   opened and cement was mixed.  The knee was irrigated with normal saline solution and pulse lavage.  The posterior synovial capsule was then injected with 30 cc of 0.25% Marcaine  with epinephrine , 1 cc of Toradol  and 30 cc of NS for a total of 61 cc.     Final implants were then cemented onto cleaned and dried cut surfaces of bone with the knee brought to extension with a size 7 mm CR trial insert.      Once the cement had fully cured, excess cement was removed   throughout the knee.  I confirmed that I was satisfied with the range of   motion and stability, and the final size 7 mm CR MS AOX insert was chosen.  It was   placed into the knee.      At this point in the case there was no significant   hemostasis was required.  The extensor mechanism was then reapproximated using #1 Vicryl and #1 Stratafix sutures with the knee   in flexion.  The   remaining wound  was closed with 2-0 Vicryl and running 4-0 Monocryl.   The  knee was cleaned, dried, dressed sterilely using Dermabond and   Aquacel dressing.  The patient was then   brought to recovery room in stable condition, tolerating the procedure   well.   Please note that PA Patti was present for the entirety of the case, and was utilized for pre-operative positioning, peri-operative retractor management, general facilitation of the procedure and for primary wound closure at the end of the case.              Donnice CORDOBA Ernie, M.D.  "

## 2024-08-15 NOTE — H&P (Signed)
 TOTAL KNEE ADMISSION H&P  Patient is being admitted for right total knee arthroplasty.  Therapy Plans: outpatient therapy at [facility] Disposition: Home with daughter in law Planned DVT Prophylaxis: aspirin  81mg  BID DME needed: walker PCP: Dr. Shayne - clearance received TXA: IV Allergies: pantoprazole /lansoprazole  - diarrhea Anesthesia Concerns: none BMI: 23 Last HgbA1c: Not diabetic   Other: - staying overnight - ice machine at hospital - hx of colon cancer, did not do well with tramadol  (February 2025 - resection) - celebrex  daily with food (GERD) - oxycodone , robaxin , tylenol , celebrex   Subjective:  Chief Complaint: right knee pain.  HPI: Sherri Morris, 79 y.o. female has a history of pain and functional disability in the right knee due to osteoarthritis and has failed non-surgical conservative treatments for greater than 12 weeks to include NSAID's and/or analgesics, corticosteriod injections, and activity modification. Onset of symptoms was gradual, starting 2 years ago with gradually worsening course since that time. The patient noted no past surgery on the right  knee.  Patient currently rates pain in the right knee at 2 out of 10 with activity. Patient has worsening of pain with activity and weight bearing and pain that interferes with activities of daily living. Patient has evidence of joint space narrowing by imaging studies. There is no active infection.  Patient Active Problem List   Diagnosis Date Noted   Colon cancer, ascending (HCC) 09/16/2023   Adenocarcinoma of cecum (HCC) 08/23/2023   Allergic rhinitis due to animal (cat) (dog) hair and dander 12/11/2020   Allergic rhinitis due to pollen 12/11/2020   Chronic allergic conjunctivitis 12/11/2020   Cough 12/11/2020   Gastro-esophageal reflux disease without esophagitis 12/11/2020   Impingement syndrome of left shoulder region 11/07/2019   Status post corneal transplant 05/01/2011    Past Medical History:  Diagnosis  Date   Allergy    enviromental   Arthritis    CAD (coronary artery disease)    Cancer (HCC)    skin    Cataract    GERD (gastroesophageal reflux disease)    Hyperlipidemia    Osteopenia    Shingles    Skin cancer    Skin melanoma (HCC)    Vitamin D insufficiency     Past Surgical History:  Procedure Laterality Date   CHOLECYSTECTOMY     colon resection surgery      CORNEAL TRANSPLANT Right 08/03/2010   CORNEAL TRANSPLANT Left 06/2020   FOOT FRACTURE SURGERY Left 08/04/2011   MELANOMA EXCISION  08/04/2011   has multiple squamous cells and melanoma excisions   ROTATOR CUFF REPAIR Left 12/2019   TONSILLECTOMY AND ADENOIDECTOMY      Prior to Admission medications  Medication Sig Start Date End Date Taking? Authorizing Provider  acetaminophen  (TYLENOL ) 500 MG tablet Take 500-1,000 mg by mouth every 6 (six) hours as needed (pain.).   Yes [provider]  estrogen, conjugated,-medroxyprogesterone (PREMPRO ) 0.3-1.5 MG per tablet Take 1 tablet by mouth in the morning.   Yes [provider]  ibuprofen (ADVIL) 200 MG tablet Take 200-600 mg by mouth every 8 (eight) hours as needed (pain.).   Yes [provider]  Multiple Vitamin (MULTIVITAMIN WITH MINERALS) TABS tablet Take 1 tablet by mouth in the morning.   Yes [provider]  RABEprazole  (ACIPHEX ) 20 MG tablet Take 1 tablet (20 mg total) by mouth daily. 11/02/23  Yes Pyrtle, Gordy HERO, MD  rosuvastatin  (CRESTOR ) 10 MG tablet Take 10 mg by mouth at bedtime.   Yes [provider]  vitamin C (ASCORBIC ACID) 500 MG tablet Take 500 mg by mouth daily in the afternoon.   Yes [provider]    Allergies[1]  Social History   Socioeconomic History   Marital status: Married    Spouse name: Not on file   Number of children: 2   Years of education: Not on file   Highest education level: Not on file  Occupational History   Not on file  Tobacco Use   Smoking status: Former    Types:  Cigarettes   Smokeless tobacco: Never   Tobacco comments:    1 CIG A DAY  Vaping Use   Vaping status: Never Used  Substance and Sexual Activity   Alcohol  use: Yes    Alcohol /week: 5.0 standard drinks of alcohol     Types: 5 Standard drinks or equivalent per week    Comment: on occas   Drug use: No   Sexual activity: Not Currently  Other Topics Concern   Not on file  Social History Narrative   Not on file   Social Drivers of Health   Tobacco Use: Medium Risk (08/15/2024)   Patient History    Smoking Tobacco Use: Former    Smokeless Tobacco Use: Never    Passive Exposure: Not on Actuary Strain: Not on file  Food Insecurity: No Food Insecurity (09/16/2023)   Hunger Vital Sign    Worried About Running Out of Food in the Last Year: Never true    Ran Out of Food in the Last Year: Never true  Transportation Needs: No Transportation Needs (09/16/2023)   PRAPARE - Administrator, Civil Service (Medical): No    Lack of Transportation (Non-Medical): No  Physical Activity: Not on file  Stress: Not on file  Social Connections: Socially Isolated (09/16/2023)   Social Connection and Isolation Panel    Frequency of Communication with Friends and Family: Once a week    Frequency of Social Gatherings with Friends and Family: Once a week    Attends Religious Services: Never    Database Administrator or Organizations: No    Attends Banker Meetings: Never    Marital Status: Married  Catering Manager Violence: Not At Risk (09/16/2023)   Humiliation, Afraid, Rape, and Kick questionnaire    Fear of Current or Ex-Partner: No    Emotionally Abused: No    Physically Abused: No    Sexually Abused: No  Depression (PHQ2-9): Low Risk (08/24/2023)   Depression (PHQ2-9)    PHQ-2 Score: 0  Alcohol  Screen: Not on file  Housing: Low Risk (09/16/2023)   Housing Stability Vital Sign    Unable to Pay for Housing in the Last Year: No    Number of Times Moved in the  Last Year: 0    Homeless in the Last Year: No  Utilities: Not At Risk (09/16/2023)   AHC Utilities    Threatened with loss of utilities: No  Health Literacy: Not on file    Tobacco Use: Medium Risk (08/15/2024)   Patient History    Smoking Tobacco Use: Former    Smokeless Tobacco Use: Never    Passive Exposure: Not on file   Social History   Substance and Sexual Activity  Alcohol  Use Yes   Alcohol /week: 5.0 standard drinks of alcohol    Types: 5 Standard drinks or equivalent per week   Comment: on occas    Family History  Problem Relation Age of Onset   Stroke Mother  Diabetes Mellitus II Mother    Hypertension Mother    Heart attack Father    Heart disease Father    Angina Father    Hypertension Father    Peripheral vascular disease Father    Hypertension Brother    Hypertension Brother    Hypertension Brother    Colon cancer Neg Hx    Colon polyps Neg Hx    Esophageal cancer Neg Hx    Stomach cancer Neg Hx    Rectal cancer Neg Hx     Review Of Systems: Constitutional: Constitutional: no fever, chills, night sweats, or significant weight loss. Cardiovascular: Cardiovascular: no palpitations or chest pain. Respiratory: Respiratory: no cough or shortness of breath and No COPD. Gastrointestinal: Gastrointestinal: no vomiting or nausea. Musculoskeletal: Musculoskeletal: Joint Pain and swelling in Joints. Neurologic: Neurologic: no numbness, tingling, or difficulty with balance.  Objective:  Physical Exam: Right knee exam: No palpable effusion, warmth erythema Slight flexion contracture associated with her valgus right knee. Tenderness laterally Passively correctable valgus Normal ipsilateral right hip exam without groin pain or referred pain No significant lower extremity edema, erythema or calf tenderness Left knee exam reveals a valgus left knee without as much discomfort currently.  Vital signs in last 24 hours: Temp:  [98.6 F (37 C)] 98.6 F (37 C)  (01/13 0609) Pulse Rate:  [68] 68 (01/13 0609) Resp:  [16] 16 (01/13 0609) BP: (132)/(72) 132/72 (01/13 0609) SpO2:  [98 %] 98 % (01/13 0609) Weight:  [64.4 kg] 64.4 kg (01/13 0609)  Imaging Review Plain radiographs demonstrate severe degenerative joint disease of the right knee.  The bone quality appears to be adequate for age and reported activity level.  Assessment/Plan:  End stage arthritis, right knee   The patient history, physical examination, clinical judgment of the provider and imaging studies are consistent with end stage degenerative joint disease of the right knee and total knee arthroplasty is deemed medically necessary. The treatment options including medical management, injection therapy arthroscopy and arthroplasty were discussed at length. The risks and benefits of total knee arthroplasty were presented and reviewed. The risks due to aseptic loosening, infection, stiffness, patella tracking problems, thromboembolic complications and other imponderables were discussed. The patient acknowledged the explanation, agreed to proceed with the plan and consent was signed. Patient is being admitted for inpatient treatment for surgery, pain control, PT, OT, prophylactic antibiotics, VTE prophylaxis, progressive ambulation and ADLs and discharge planning. The patient is planning to be discharged home.   Patient's anticipated LOS is less than 2 midnights, meeting these requirements: - Younger than 57 - Lives within 1 hour of care - Has a competent adult at home to recover with post-op recover - NO history of  - Chronic pain requiring opiods  - Diabetes  - Coronary Artery Disease  - Heart failure  - Heart attack  - Stroke  - DVT/VTE  - Cardiac arrhythmia  - Respiratory Failure/COPD  - Renal failure  - Anemia  - Advanced Liver disease    Rosina Calin, PA-C Orthopedic Surgery [facility] [place] [phone]      [1]  Allergies Allergen Reactions    Lansoprazole  Diarrhea   Pantoprazole  Diarrhea

## 2024-08-15 NOTE — Transfer of Care (Signed)
 Immediate Anesthesia Transfer of Care Note  Patient: Sherri Morris  Procedure(s) Performed: ARTHROPLASTY, KNEE, TOTAL (Right: Knee)  Patient Location: PACU  Anesthesia Type:General  Level of Consciousness: awake, drowsy, and patient cooperative  Airway & Oxygen Therapy: Patient Spontanous Breathing and Patient connected to face mask oxygen  Post-op Assessment: Report given to RN and Post -op Vital signs reviewed and stable  Post vital signs: Reviewed and stable  Last Vitals:  Vitals Value Taken Time  BP 124/57 08/15/24 09:00  Temp    Pulse 80 08/15/24 09:02  Resp 21 08/15/24 09:02  SpO2 100 % 08/15/24 09:02  Vitals shown include unfiled device data.  Last Pain:  Vitals:   08/15/24 0609  TempSrc: Oral  PainSc: 0-No pain         Complications: No notable events documented.

## 2024-08-15 NOTE — Anesthesia Procedure Notes (Addendum)
 Procedure Name: MAC Date/Time: 08/15/2024 7:16 AM  Performed by: Metta Andrea NOVAK, CRNAPre-anesthesia Checklist: Patient identified, Emergency Drugs available, Suction available, Patient being monitored and Timeout performed Oxygen Delivery Method: Simple face mask Placement Confirmation: positive ETCO2

## 2024-08-15 NOTE — Interval H&P Note (Signed)
 History and Physical Interval Note:  08/15/2024 6:55 AM  Sherri Morris  has presented today for surgery, with the diagnosis of Right knee osteoarthritis.  The various methods of treatment have been discussed with the patient and family. After consideration of risks, benefits and other options for treatment, the patient has consented to  Procedures: ARTHROPLASTY, KNEE, TOTAL (Right) as a surgical intervention.  The patient's history has been reviewed, patient examined, no change in status, stable for surgery.  I have reviewed the patient's chart and labs.  Questions were answered to the patient's satisfaction.     [name]

## 2024-08-15 NOTE — Progress Notes (Signed)
 Physical Therapy Treatment Patient Details Name: Sherri Morris MRN: [id number] DOB: [birth year] Today's Date: 08/15/2024   History of Present Illness 79 yo female s/p RTKA 08/15/24. PMH:CAD,      gerd, L RCR, L foot fracture    PT Comments   Patient requesting to return to bed. Assisted to stand and step to bed, cues for hand and right leg position.Upon sitting onto bed another episode of emesis. RN aware. Continue PT for  mobility and steps.     If plan is discharge home, recommend the following: A little help with walking and/or transfers;A little help with bathing/dressing/bathroom;Help with stairs or ramp for entrance   Can travel by private vehicle        Equipment Recommendations  None recommended by PT    Recommendations for Other Services       Precautions / Restrictions Precautions Precautions: Fall;Knee Recall of Precautions/Restrictions: Intact Precaution/Restrictions Comments: easily N/V Restrictions Weight Bearing Restrictions Per Provider Order: No     Mobility  Bed Mobility Overal bed mobility: Needs Assistance Bed Mobility: Sit to Supine     Supine to sit: Contact guard Sit to supine: Supervision   General bed mobility comments: able to lift the R ight leg    Transfers Overall transfer level: Needs assistance Equipment used: Rolling walker (2 wheels) Transfers: Sit to/from Stand, Bed to chair/wheelchair/BSC Sit to Stand: Min assist           General transfer comment: cues for safety    Ambulation/Gait  Stairs             Wheelchair Mobility     Tilt Bed    Modified Rankin (Stroke Patients Only)       Balance Overall balance assessment: Mild deficits observed, not formally tested                                          Communication Communication Communication: No apparent difficulties  Cognition Arousal: Alert Behavior During Therapy: Anxious, Restless   PT - Cognitive impairments: No apparent  impairments                       PT - Cognition Comments: some cues for  sequencing and plan after N/V episode Following commands: Intact      Cueing Cueing Techniques: Verbal cues  Exercises   General Comments        Pertinent Vitals/Pain Pain Assessment Pain Assessment: 0-10 Pain Score: 0-No pain Pain Location: right knee Pain Descriptors / Indicators: Discomfort Pain Intervention(s): Premedicated before session, Monitored during session, Ice applied    Home Living Family/patient expects to be discharged to:: Private residence Living Arrangements: Spouse/significant other Available Help at Discharge: Family Type of Home: House Home Access: Stairs to enter Entrance Stairs-Rails: None Secretary/administrator of Steps: 2   Home Layout: One level Home Equipment: Agricultural Consultant (2 wheels);BSC/3in1 Additional Comments: going to son's house first, herhome is 2 level    Prior Function            PT Goals (current goals can now be found in the care plan section) Acute Rehab PT Goals Patient Stated Goal: go home PT Goal Formulation: With patient/family Time For Goal Achievement: 08/22/24 Potential to Achieve Goals: Good Progress towards PT goals: Progressing toward goals    Frequency    7X/week      PT Plan  Co-evaluation              AM-PAC PT 6 Clicks Mobility   Outcome Measure  Help needed turning from your back to your side while in a flat bed without using bedrails?: A Little Help needed moving from lying on your back to sitting on the side of a flat bed without using bedrails?: A Little Help needed moving to and from a bed to a chair (including a wheelchair)?: A Little Help needed standing up from a chair using your arms (e.g., wheelchair or bedside chair)?: A Little Help needed to walk in hospital room?: A Little Help needed climbing 3-5 steps with a railing? : A Little 6 Click Score: 18    End of Session Equipment Utilized  During Treatment: Gait belt Activity Tolerance: Patient tolerated treatment well Patient left: in bed;with call bell/phone within reach;with bed alarm set;with family/visitor present Nurse Communication: Mobility status PT Visit Diagnosis: Unsteadiness on feet (R26.81);Muscle weakness (generalized) (M62.81)     Time: 8293-8269 PT Time Calculation (min) (ACUTE ONLY): 24 min  Charges:   $Therapeutic Activity: 23-37 mins PT General Charges $$ ACUTE PT VISIT: 1 Visit                     Darice Potters PT Acute Rehabilitation Services Office [phone]    Potters Darice Norris 08/15/2024, 5:33 PM

## 2024-08-15 NOTE — Discharge Instructions (Signed)

## 2024-08-15 NOTE — Anesthesia Postprocedure Evaluation (Addendum)
"   Anesthesia Post Note  Patient: Sherri Morris  Procedure(s) Performed: ARTHROPLASTY, KNEE, TOTAL (Right: Knee)     Patient location during evaluation: PACU Anesthesia Type: Spinal Level of consciousness: awake and alert Pain management: pain level controlled (pain improving) Vital Signs Assessment: post-procedure vital signs reviewed and stable Respiratory status: spontaneous breathing, nonlabored ventilation and respiratory function stable Cardiovascular status: blood pressure returned to baseline and stable Postop Assessment: no apparent nausea or vomiting, no backache and patient able to bend at knees Anesthetic complications: no   No notable events documented.  Last Vitals:  Vitals:   08/15/24 1045 08/15/24 1100  BP: (!) 113/59 109/60  Pulse: (!) 58 64  Resp: 13 12  Temp:  (!) 36.3 C  SpO2: 100% 100%    Last Pain:  Vitals:   08/15/24 1100  TempSrc:   PainSc: Asleep                 Karma Ansley,E. Shaquayla Klimas      "

## 2024-08-15 NOTE — Anesthesia Procedure Notes (Addendum)
 Procedure Name: LMA Insertion Date/Time: 08/15/2024 7:45 AM  Performed by: Metta Andrea NOVAK, CRNAPre-anesthesia Checklist: Patient identified, Emergency Drugs available, Suction available, Patient being monitored and Timeout performed Patient Re-evaluated:Patient Re-evaluated prior to induction Oxygen Delivery Method: Circle system utilized Preoxygenation: Pre-oxygenation with 100% oxygen Induction Type: IV induction Ventilation: Mask ventilation without difficulty LMA: LMA inserted LMA Size: 4.0 Number of attempts: 1 Placement Confirmation: positive ETCO2 Tube secured with: Tape Dental Injury: Teeth and Oropharynx as per pre-operative assessment  Comments: Placed by Dr Leonce

## 2024-08-15 NOTE — Anesthesia Procedure Notes (Signed)
 Spinal  Patient location during procedure: OR End time: 08/15/2024 7:23 AM Reason for block: surgical anesthesia  Staffing Performed: anesthesiologist  Authorized by: Sherri Athens, MD   Performed by: Sherri Athens, MD  Preanesthetic Checklist Completed: patient identified, IV checked, site marked, risks and benefits discussed, surgical consent, monitors and equipment checked, pre-op evaluation and timeout performed Spinal Block Patient position: sitting Prep: DuraPrep Patient monitoring: heart rate, cardiac monitor, continuous pulse ox and blood pressure Approach: midline Location: L3-4 Injection technique: single-shot Needle Needle type: Sprotte, Pencan and Introducer  Needle gauge: 24 G Needle length: 9 cm Assessment Sensory level: T4 Events: CSF return  Additional Notes Pt identified in Operating room.  Monitors applied. Working IV access confirmed. Sterile prep, drape lumbar spine.  1% lido local L 3,4.  #24ga Pencan into clear CSF L 3,4.  60mg  2% Mepivacaine  injected with asp CSF beginning and end of injection.  Patient asymptomatic, VSS, no heme aspirated, tolerated well.  Sherri Morris Leonce, MD

## 2024-08-15 NOTE — Care Plan (Signed)
 Ortho Bundle Case Management Note  Patient Details  Name: Sherri Morris MRN: [id number] Date of Birth: 1946/06/23  R TKA on 08/15/24  DCP: Home with daughter in law  DME: RW ordered through [employer]  PT: [facility]                   DME Arranged:  Vannie rolling DME Agency:  [employer]  HH Arranged:    HH Agency:     Additional Comments: Please contact me with any questions of if this plan should need to change.  Lyle Pepper, CCM [facility] [phone]  Ext. [phone]   08/15/2024, 7:57 AM

## 2024-08-16 ENCOUNTER — Encounter (HOSPITAL_COMMUNITY): Payer: Self-pay | Admitting: Orthopedic Surgery

## 2024-08-16 ENCOUNTER — Other Ambulatory Visit (HOSPITAL_COMMUNITY): Payer: Self-pay

## 2024-08-16 DIAGNOSIS — M1711 Unilateral primary osteoarthritis, right knee: Secondary | ICD-10-CM | POA: Diagnosis not present

## 2024-08-16 LAB — CBC
HCT: 31.5 % — ABNORMAL LOW (ref 36.0–46.0)
Hemoglobin: 10.6 g/dL — ABNORMAL LOW (ref 12.0–15.0)
MCH: 31.8 pg (ref 26.0–34.0)
MCHC: 33.7 g/dL (ref 30.0–36.0)
MCV: 94.6 fL (ref 80.0–100.0)
Platelets: 195 K/uL (ref 150–400)
RBC: 3.33 MIL/uL — ABNORMAL LOW (ref 3.87–5.11)
RDW: 12.2 % (ref 11.5–15.5)
WBC: 7.9 K/uL (ref 4.0–10.5)
nRBC: 0 % (ref 0.0–0.2)

## 2024-08-16 LAB — BASIC METABOLIC PANEL WITH GFR
Anion gap: 7 (ref 5–15)
BUN: 9 mg/dL (ref 8–23)
CO2: 26 mmol/L (ref 22–32)
Calcium: 9.1 mg/dL (ref 8.9–10.3)
Chloride: 107 mmol/L (ref 98–111)
Creatinine, Ser: 0.68 mg/dL (ref 0.44–1.00)
GFR, Estimated: >60 mL/min
Glucose, Bld: 99 mg/dL (ref 70–99)
Potassium: 4.2 mmol/L (ref 3.5–5.1)
Sodium: 140 mmol/L (ref 135–145)

## 2024-08-16 MED ORDER — POLYETHYLENE GLYCOL 3350 17 GM/SCOOP PO POWD
17.0000 g | Freq: Two times a day (BID) | ORAL | 0 refills | Status: AC
  Filled 2024-08-16: qty 238, 7d supply, fill #0

## 2024-08-16 MED ORDER — CELECOXIB 200 MG PO CAPS
200.0000 mg | ORAL_CAPSULE | Freq: Every day | ORAL | 0 refills | Status: AC
  Filled 2024-08-16: qty 60, 60d supply, fill #0

## 2024-08-16 MED ORDER — OXYCODONE HCL 5 MG PO TABS
5.0000 mg | ORAL_TABLET | ORAL | 0 refills | Status: AC | PRN
  Filled 2024-08-16: qty 42, 7d supply, fill #0

## 2024-08-16 MED ORDER — METHOCARBAMOL 500 MG PO TABS
500.0000 mg | ORAL_TABLET | Freq: Four times a day (QID) | ORAL | 0 refills | Status: AC | PRN
  Filled 2024-08-16: qty 40, 10d supply, fill #0

## 2024-08-16 MED ORDER — ASPIRIN 81 MG PO CHEW
81.0000 mg | CHEWABLE_TABLET | Freq: Two times a day (BID) | ORAL | 0 refills | Status: AC
  Filled 2024-08-16: qty 56, 28d supply, fill #0

## 2024-08-16 MED ORDER — SENNA 8.6 MG PO TABS
2.0000 | ORAL_TABLET | Freq: Every day | ORAL | 0 refills | Status: AC
  Filled 2024-08-16: qty 28, 14d supply, fill #0

## 2024-08-16 MED ORDER — ONDANSETRON HCL 4 MG PO TABS
4.0000 mg | ORAL_TABLET | Freq: Four times a day (QID) | ORAL | 0 refills | Status: AC | PRN
  Filled 2024-08-16: qty 20, 5d supply, fill #0

## 2024-08-16 NOTE — Progress Notes (Signed)
 Physical Therapy Treatment Patient Details Name: Sherri Morris MRN: [id number] DOB: [birth year] Today's Date: 08/16/2024   History of Present Illness 79 yo female s/p RTKA 08/15/24. PMH:CAD,      gerd, L RCR, L foot fracture    PT Comments  POD # 1 pm session Pt feeling much better after completion of IV fluids and increased oral intake.  Suspect she was dehydrated due to yesterday's episodes of nausea and vomiting. Pt self able to get OOB using her strap.  Has been up to the bathroom several times with no c/o's.  Assisted with amb in hallway and practiced stairs went well.  Then returned to room to perform some TE's following HEP handout.  Instructed on proper tech, freq as well as use of ICE.   Addressed all mobility questions, discussed appropriate activity, educated on use of ICE.   Pt ready for D/C to Son's home (one level) with family support.     If plan is discharge home, recommend the following: A little help with walking and/or transfers;A little help with bathing/dressing/bathroom;Help with stairs or ramp for entrance   Can travel by private vehicle        Equipment Recommendations  None recommended by PT    Recommendations for Other Services       Precautions / Restrictions Precautions Precautions: Fall;Knee Recall of Precautions/Restrictions: Intact Precaution/Restrictions Comments: no pillow under knee Restrictions Weight Bearing Restrictions Per Provider Order: No     Mobility  Bed Mobility Overal bed mobility: Modified Independent             General bed mobility comments: self able using her strap    Transfers Overall transfer level: Modified independent Equipment used: Rolling walker (2 wheels) Transfers: Sit to/from Stand, Bed to chair/wheelchair/BSC Sit to Stand: Min assist, Contact guard assist           General transfer comment: self able with good safety cognition    Ambulation/Gait Ambulation/Gait assistance: Supervision Gait  Distance (Feet): 70 Feet Assistive device: Rolling walker (2 wheels) Gait Pattern/deviations: Step-to pattern Gait velocity: decr     General Gait Details: tolerated an increased distance with no c/o's.  Feeling much better after fluids (IV + drinking).  Good safety cognition.  Pain controlled 5/10.   Stairs Stairs: Yes Stairs assistance: Supervision, Contact guard assist Stair Management: No rails, Two rails, Step to pattern, Forwards, With walker Number of Stairs: 3 General stair comments: First, up/down 2 steps using B rails then up/down ONE step with walker at Min VC's on proper sequencing.  Performed well.   Wheelchair Mobility     Tilt Bed    Modified Rankin (Stroke Patients Only)       Balance                                            Communication Communication Communication: No apparent difficulties  Cognition Arousal: Alert Behavior During Therapy: WFL for tasks assessed/performed   PT - Cognitive impairments: No apparent impairments                        Following commands: Intact      Cueing Cueing Techniques: Verbal cues  Exercises   10 reps all seated TE's following HEP TKR   General Comments        Pertinent Vitals/Pain Pain Assessment Pain Assessment: 0-10  Pain Score: 5  Pain Location: right knee Pain Descriptors / Indicators: Discomfort Pain Intervention(s): Monitored during session, Premedicated before session, Repositioned, Ice applied    Home Living                          Prior Function            PT Goals (current goals can now be found in the care plan section) Progress towards PT goals: Progressing toward goals    Frequency    7X/week      PT Plan      Co-evaluation              AM-PAC PT 6 Clicks Mobility   Outcome Measure  Help needed turning from your back to your side while in a flat bed without using bedrails?: None Help needed moving from lying on your  back to sitting on the side of a flat bed without using bedrails?: None Help needed moving to and from a bed to a chair (including a wheelchair)?: None Help needed standing up from a chair using your arms (e.g., wheelchair or bedside chair)?: None Help needed to walk in hospital room?: None Help needed climbing 3-5 steps with a railing? : A Little 6 Click Score: 23    End of Session Equipment Utilized During Treatment: Gait belt Activity Tolerance: Patient tolerated treatment well Patient left: in bed;with call bell/phone within reach;with family/visitor present Nurse Communication: Mobility status PT Visit Diagnosis: Unsteadiness on feet (R26.81);Muscle weakness (generalized) (M62.81)     Time: 8594-8567 PT Time Calculation (min) (ACUTE ONLY): 27 min  Charges:    $Gait Training: 8-22 mins $Therapeutic Activity: 8-22 mins PT General Charges $$ ACUTE PT VISIT: 1 Visit                    Katheryn Leap  PTA Acute  Rehabilitation Services Office M-F          [phone]

## 2024-08-16 NOTE — Progress Notes (Signed)
 "  Subjective: 1 Day Post-Op Procedures (LRB): ARTHROPLASTY, KNEE, TOTAL (Right) Patient reports pain as mild.   Patient seen in rounds for Dr. Ernie. Patient is resting in bed on exam this morning. She was tearful when I entered the room, as she told me she was woken up early this morning and told she was getting ready to be discharged. We discussed this in detail and she felt better. We will start therapy today.   Objective: Vital signs in last 24 hours: Temp:  [97.1 F (36.2 C)-98.8 F (37.1 C)] 98.8 F (37.1 C) (01/14 0534) Pulse Rate:  [58-86] 64 (01/14 0534) Resp:  [11-23] 17 (01/14 0534) BP: (96-130)/(39-67) 113/52 (01/14 0534) SpO2:  [96 %-100 %] 99 % (01/14 0534)  Intake/Output from previous day:  Intake/Output Summary (Last 24 hours) at 08/16/2024 0846 Last data filed at 08/16/2024 0534 Gross per 24 hour  Intake 1731.46 ml  Output 775 ml  Net 956.46 ml     Intake/Output this shift: No intake/output data recorded.  Labs: Recent Labs    08/16/24 0315  HGB 10.6*   Recent Labs    08/16/24 0315  WBC 7.9  RBC 3.33*  HCT 31.5*  PLT 195   Recent Labs    08/15/24 0628 08/16/24 0315  NA 138 140  K 3.6 4.2  CL 103 107  CO2 26 26  BUN 13 9  CREATININE 0.73 0.68  GLUCOSE 118* 99  CALCIUM  9.6 9.1   No results for input(s): LABPT, INR in the last 72 hours.  Exam: General - Patient is Alert and Oriented Extremity - Neurologically intact Sensation intact distally Intact pulses distally Dorsiflexion/Plantar flexion intact Dressing - dressing C/D/I Motor Function - intact, moving foot and toes well on exam.   Past Medical History:  Diagnosis Date   Allergy    enviromental   Arthritis    CAD (coronary artery disease)    Cancer (HCC)    skin    Cataract    GERD (gastroesophageal reflux disease)    Hyperlipidemia    Osteopenia    Shingles    Skin cancer    Skin melanoma (HCC)    Vitamin D insufficiency     Assessment/Plan: 1 Day Post-Op  Procedures (LRB): ARTHROPLASTY, KNEE, TOTAL (Right) Principal Problem:   S/P total knee arthroplasty, right  Estimated body mass index is 22.24 kg/m as calculated from the following:   Height as of this encounter: 5' 7 (1.702 m).   Weight as of this encounter: 64.4 kg. Advance diet Up with therapy   Patient's anticipated LOS is less than 2 midnights, meeting these requirements: - Younger than 24 - Lives within 1 hour of care - Has a competent adult at home to recover with post-op recover - NO history of  - Chronic pain requiring opiods  - Diabetes  - Coronary Artery Disease  - Heart failure  - Heart attack  - Stroke  - DVT/VTE  - Cardiac arrhythmia  - Respiratory Failure/COPD  - Renal failure  - Anemia  - Advanced Liver disease     DVT Prophylaxis - Aspirin  Weight bearing as tolerated.  Hgb stable at 10.6 this AM.  Plan is to go Home after hospital stay. Plan for discharge today following 1-2 sessions of PT as long as they are meeting their goals. Otherwise, discharge home tomorrow. Patient is scheduled for OPPT. Follow up in the office in 2 weeks.   Rosina Calin, PA-C Orthopedic Surgery [phone] 08/16/2024, 8:46 AM  "

## 2024-08-16 NOTE — Progress Notes (Signed)
 The patient did not experience nausea this shift.

## 2024-08-16 NOTE — Care Management Obs Status (Signed)
 MEDICARE OBSERVATION STATUS NOTIFICATION   Patient Details  Name: Sherri Morris MRN: [id number] Date of Birth: Dec 20, 1945   Medicare Observation Status Notification Given:  Yes    NORMAN ASPEN, LCSW 08/16/2024, 11:02 AM

## 2024-08-16 NOTE — Progress Notes (Signed)
 Physical Therapy Treatment Patient Details Name: Sherri Morris MRN: [id number] DOB: 1946/05/24 Today's Date: 08/16/2024   History of Present Illness 79 yo female s/p RTKA 08/15/24. PMH:CAD,      gerd, L RCR, L foot fracture    PT Comments  POD # 1 am session Pt amb out of the bathroom with NT c/o feeling faint seated EOB at foot of bed.  General Gait Details: Limited amb distance due to c/o feeling light and hot.  BP's are soft.  standing 92/53.  Reported to nurse.  Assisted from foot of bed to recliner.  Encouraged fluids.  Then perform some TE's following HEP handout.  Instructed on proper tech, freq as well as use of ICE.   Will see Pt again this afternoon.      If plan is discharge home, recommend the following: A little help with walking and/or transfers;A little help with bathing/dressing/bathroom;Help with stairs or ramp for entrance   Can travel by private vehicle        Equipment Recommendations  None recommended by PT    Recommendations for Other Services       Precautions / Restrictions Precautions Precautions: Fall;Knee Recall of Precautions/Restrictions: Intact Precaution/Restrictions Comments: no pillow under knee Restrictions Weight Bearing Restrictions Per Provider Order: No     Mobility  Bed Mobility               General bed mobility comments: OOB    Transfers Overall transfer level: Needs assistance Equipment used: Rolling walker (2 wheels) Transfers: Sit to/from Stand, Bed to chair/wheelchair/BSC Sit to Stand: Min assist, Contact guard assist           General transfer comment: cues for safety    Ambulation/Gait Ambulation/Gait assistance: Min assist Gait Distance (Feet): 4 Feet Assistive device: Rolling walker (2 wheels) Gait Pattern/deviations: Step-to pattern Gait velocity: decr     General Gait Details: Limited amb distance due to c/o feeling light and hot.  BP's are soft.  standing 92/53.  Reported to  nurse.   Stairs             Wheelchair Mobility     Tilt Bed    Modified Rankin (Stroke Patients Only)       Balance                                            Communication Communication Communication: No apparent difficulties  Cognition Arousal: Alert Behavior During Therapy: WFL for tasks assessed/performed   PT - Cognitive impairments: No apparent impairments                       PT - Cognition Comments: AxO x 3 feeling better than yesterday but still with mild c/o I feel off , hot with amb out of the bathroom. Following commands: Intact      Cueing Cueing Techniques: Verbal cues  Exercises  Total Knee Replacement TE's following HEP handout 10 reps B LE ankle pumps 05 reps towel squeezes 05 reps knee presses 05 reps heel slides  05 reps SAQ's 05 reps SLR's 05 reps ABD Educated on use of gait belt to assist with TE's Followed by ICE     General Comments        Pertinent Vitals/Pain Pain Assessment Pain Assessment: 0-10 Pain Score: 5  Pain Location: right knee Pain Descriptors / Indicators:  Discomfort Pain Intervention(s): Monitored during session, Premedicated before session, Repositioned, Ice applied    Home Living                          Prior Function            PT Goals (current goals can now be found in the care plan section) Progress towards PT goals: Progressing toward goals    Frequency    7X/week      PT Plan      Co-evaluation              AM-PAC PT 6 Clicks Mobility   Outcome Measure  Help needed turning from your back to your side while in a flat bed without using bedrails?: A Little Help needed moving from lying on your back to sitting on the side of a flat bed without using bedrails?: A Little Help needed moving to and from a bed to a chair (including a wheelchair)?: A Little Help needed standing up from a chair using your arms (e.g., wheelchair or bedside  chair)?: A Little Help needed to walk in hospital room?: A Little Help needed climbing 3-5 steps with a railing? : A Lot 6 Click Score: 17    End of Session Equipment Utilized During Treatment: Gait belt Activity Tolerance: Patient limited by fatigue Patient left: in chair;with call bell/phone within reach;with family/visitor present Nurse Communication: Mobility status PT Visit Diagnosis: Unsteadiness on feet (R26.81);Muscle weakness (generalized) (M62.81)     Time: 9083-9055 PT Time Calculation (min) (ACUTE ONLY): 28 min  Charges:    $Gait Training: 8-22 mins $Therapeutic Exercise: 8-22 mins PT General Charges $$ ACUTE PT VISIT: 1 Visit                     Katheryn Leap  PTA Acute  Rehabilitation Services Office M-F          [phone]

## 2024-08-16 NOTE — Progress Notes (Signed)
 Discharge instructions given to patient and family questions asked and answered.

## 2024-08-16 NOTE — TOC Transition Note (Signed)
 Transition of Care [facility]) - Discharge Note   Patient Details  Name: Sherri Morris MRN: [id number] Date of Birth: 08/08/1945  [facility]) CM/SW Contact:  NORMAN ASPEN, LCSW Phone Number: 08/16/2024, 10:08 AM   Clinical Narrative:     Met with pt and confirming she has received RW from [employer].  OPPT already arranged with [facility].  No further IP CM needs.  Final next level of care: OP Rehab Barriers to Discharge: No Barriers Identified   Patient Goals and CMS Choice Patient states their goals for this hospitalization and ongoing recovery are:: return home          Discharge Placement                       Discharge Plan and Services Additional resources added to the After Visit Summary for                  DME Arranged: Walker rolling DME Agency: [employer]                  Social Drivers of Health (SDOH) Interventions SDOH Screenings   Food Insecurity: No Food Insecurity (08/15/2024)  Housing: Low Risk (08/15/2024)  Transportation Needs: No Transportation Needs (08/15/2024)  Utilities: Not At Risk (08/15/2024)  Depression (PHQ2-9): Low Risk (08/24/2023)  Social Connections: Socially Isolated (08/15/2024)  Tobacco Use: Medium Risk (08/15/2024)     Readmission Risk Interventions    09/17/2023    2:29 PM  Readmission Risk Prevention Plan  Post Dischage Appt Complete  Medication Screening Complete  Transportation Screening Complete

## 2024-08-17 NOTE — Discharge Summary (Signed)
 Patient ID: Sherri Morris MRN: [id number] DOB/AGE: January 23, 1946 79 y.o.  Admit date: 08/15/2024 Discharge date: 08/16/2024  Admission Diagnoses:  Right knee osteoarthritis  Discharge Diagnoses:  Principal Problem:   S/P total knee arthroplasty, right   Past Medical History:  Diagnosis Date   Allergy    enviromental   Arthritis    CAD (coronary artery disease)    Cancer (HCC)    skin    Cataract    GERD (gastroesophageal reflux disease)    Hyperlipidemia    Osteopenia    Shingles    Skin cancer    Skin melanoma (HCC)    Vitamin D insufficiency     Surgeries: Procedures: ARTHROPLASTY, KNEE, TOTAL on 08/15/2024   Consultants:   Discharged Condition: Improved  Hospital Course: AMARIAH KIERSTEAD is an 79 y.o. female who was admitted 08/15/2024 for operative treatment ofS/P total knee arthroplasty, right. Patient has severe unremitting pain that affects sleep, daily activities, and work/hobbies. After pre-op clearance the patient was taken to the operating room on 08/15/2024 and underwent  Procedures: ARTHROPLASTY, KNEE, TOTAL.    Patient was given perioperative antibiotics:  Anti-infectives (From admission, onward)    Start     Dose/Rate Route Frequency Ordered Stop   08/15/24 1400  ceFAZolin  (ANCEF ) IVPB 2g/100 mL premix        2 g 200 mL/hr over 30 Minutes Intravenous Every 6 hours 08/15/24 1252 08/15/24 2305   08/15/24 0600  ceFAZolin  (ANCEF ) IVPB 2g/100 mL premix        2 g 200 mL/hr over 30 Minutes Intravenous On call to O.R. 08/15/24 0544 08/15/24 0724        Patient was given sequential compression devices, early ambulation, and chemoprophylaxis to prevent DVT. Patient worked with PT and was meeting their goals regarding safe ambulation and transfers.  Patient benefited maximally from hospital stay and there were no complications.    Recent vital signs: Patient Vitals for the past 24 hrs:  BP Temp Temp src Pulse Resp SpO2  08/16/24 0923 (!) 95/52 99.2 F (37.3  C) Oral 73 14 98 %     Recent laboratory studies:  Recent Labs    08/15/24 0628 08/16/24 0315  WBC  --  7.9  HGB  --  10.6*  HCT  --  31.5*  PLT  --  195  NA 138 140  K 3.6 4.2  CL 103 107  CO2 26 26  BUN 13 9  CREATININE 0.73 0.68  GLUCOSE 118* 99  CALCIUM  9.6 9.1     Discharge Medications:   Allergies as of 08/16/2024       Reactions   Lansoprazole  Diarrhea   Pantoprazole  Diarrhea        Medication List     STOP taking these medications    ibuprofen 200 MG tablet Commonly known as: ADVIL       TAKE these medications    acetaminophen  500 MG tablet Commonly known as: TYLENOL  Take 500-1,000 mg by mouth every 6 (six) hours as needed (pain.).   ascorbic acid 500 MG tablet Commonly known as: VITAMIN C Take 500 mg by mouth daily in the afternoon.   Aspirin  Low Dose 81 MG chewable tablet Generic drug: aspirin  Chew 1 tablet (81 mg total) by mouth 2 (two) times daily for 28 days.   celecoxib  200 MG capsule Commonly known as: CELEBREX  Take 1 capsule (200 mg total) by mouth daily with breakfast.   estrogen (conjugated)-medroxyprogesterone 0.3-1.5 MG tablet Commonly known as:  PREMPRO  Take 1 tablet by mouth in the morning.   methocarbamol  500 MG tablet Commonly known as: ROBAXIN  Take 1 tablet (500 mg total) by mouth every 6 (six) hours as needed for muscle spasms (thigh pain).   multivitamin with minerals Tabs tablet Take 1 tablet by mouth in the morning.   ondansetron  4 MG tablet Commonly known as: ZOFRAN  Take 1 tablet (4 mg total) by mouth every 6 (six) hours as needed for nausea.   oxyCODONE  5 MG immediate release tablet Commonly known as: Oxy IR/ROXICODONE  Take 1 tablet (5 mg total) by mouth every 4 (four) hours as needed for severe pain (pain score 7-10).   polyethylene glycol powder 17 GM/SCOOP powder Commonly known as: GLYCOLAX /MIRALAX  Take 17 g by mouth 2 (two) times daily. Dissolve 1 capful (17g) in 4-8 ounces of liquid and take by mouth  twice daily.   RABEprazole  20 MG tablet Commonly known as: ACIPHEX  Take 1 tablet (20 mg total) by mouth daily.   rosuvastatin  10 MG tablet Commonly known as: CRESTOR  Take 10 mg by mouth at bedtime.   senna 8.6 MG Tabs tablet Commonly known as: SENOKOT Take 2 tablets (17.2 mg total) by mouth at bedtime for 14 days.               Discharge Care Instructions  (From admission, onward)           Start     Ordered   08/16/24 0000  Change dressing       Comments: Maintain surgical dressing until follow up in the clinic. If the edges start to pull up, may reinforce with tape. If the dressing is no longer working, may remove and cover with gauze and tape, but must keep the area dry and clean.  Call with any questions or concerns.   08/16/24 1445            Diagnostic Studies: No results found.  Disposition: Discharge disposition: 01-Home or Self Care       Discharge Instructions     Call MD / Call 911   Complete by: As directed    If you experience chest pain or shortness of breath, CALL 911 and be transported to the hospital emergency room.  If you develope a fever above 101 F, pus (white drainage) or increased drainage or redness at the wound, or calf pain, call your surgeon's office.   Change dressing   Complete by: As directed    Maintain surgical dressing until follow up in the clinic. If the edges start to pull up, may reinforce with tape. If the dressing is no longer working, may remove and cover with gauze and tape, but must keep the area dry and clean.  Call with any questions or concerns.   Constipation Prevention   Complete by: As directed    Drink plenty of fluids.  Prune juice may be helpful.  You may use a stool softener, such as Colace (over the counter) 100 mg twice a day.  Use MiraLax  (over the counter) for constipation as needed.   Diet - low sodium heart healthy   Complete by: As directed    Increase activity slowly as tolerated   Complete by:  As directed    Weight bearing as tolerated with assist device (walker, cane, etc) as directed, use it as long as suggested by your surgeon or therapist, typically at least 4-6 weeks.   Post-operative opioid taper instructions:   Complete by: As directed    POST-OPERATIVE  OPIOID TAPER INSTRUCTIONS: It is important to wean off of your opioid medication as soon as possible. If you do not need pain medication after your surgery it is ok to stop day one. Opioids include: Codeine, Hydrocodone (Norco, Vicodin), Oxycodone (Percocet, oxycontin ) and hydromorphone  amongst others.  Long term and even short term use of opiods can cause: Increased pain response Dependence Constipation Depression Respiratory depression And more.  Withdrawal symptoms can include Flu like symptoms Nausea, vomiting And more Techniques to manage these symptoms Hydrate well Eat regular healthy meals Stay active Use relaxation techniques(deep breathing, meditating, yoga) Do Not substitute Alcohol  to help with tapering If you have been on opioids for less than two weeks and do not have pain than it is ok to stop all together.  Plan to wean off of opioids This plan should start within one week post op of your joint replacement. Maintain the same interval or time between taking each dose and first decrease the dose.  Cut the total daily intake of opioids by one tablet each day Next start to increase the time between doses. The last dose that should be eliminated is the evening dose.      TED hose   Complete by: As directed    Use stockings (TED hose) for 2 weeks on both leg(s).  You may remove them at night for sleeping.        Follow-up Information     [facility] Go on 08/18/2024.   Why: You are scheduled for physical therapy Friday 08/18/24 at 8:00am; [address] Contact information: [address] [place] [address] [phone]         Patti Rosina SAUNDERS, PA-C. Go on  08/30/2024.   Specialty: Orthopedic Surgery Why: You are scheduled for a post op appointment on Wednesday 08/30/24 at 2:00pm Contact information: [address] [place] [address] [phone]                  Signed: Rosina SAUNDERS Patti 08/17/2024, 7:14 AM

## 2024-10-10 ENCOUNTER — Encounter

## 2024-10-23 ENCOUNTER — Encounter: Admitting: Internal Medicine
